# Patient Record
Sex: Male | Born: 1965 | Race: White | Hispanic: No | Smoking: Never smoker
Health system: Southern US, Community
[De-identification: ages and names within clinical notes are randomized; demographics above are authoritative.]

## PROBLEM LIST (undated history)

## (undated) DIAGNOSIS — M549 Dorsalgia, unspecified: Secondary | ICD-10-CM

## (undated) DIAGNOSIS — M5136 Other intervertebral disc degeneration, lumbar region: Secondary | ICD-10-CM

## (undated) DIAGNOSIS — M5126 Other intervertebral disc displacement, lumbar region: Secondary | ICD-10-CM

## (undated) DIAGNOSIS — R5383 Other fatigue: Secondary | ICD-10-CM

## (undated) DIAGNOSIS — M2550 Pain in unspecified joint: Secondary | ICD-10-CM

## (undated) DIAGNOSIS — M5430 Sciatica, unspecified side: Secondary | ICD-10-CM

## (undated) DIAGNOSIS — K219 Gastro-esophageal reflux disease without esophagitis: Secondary | ICD-10-CM

## (undated) DIAGNOSIS — J301 Allergic rhinitis due to pollen: Secondary | ICD-10-CM

## (undated) DIAGNOSIS — J302 Other seasonal allergic rhinitis: Secondary | ICD-10-CM

## (undated) DIAGNOSIS — M51369 Other intervertebral disc degeneration, lumbar region without mention of lumbar back pain or lower extremity pain: Secondary | ICD-10-CM

## (undated) DIAGNOSIS — Z85828 Personal history of other malignant neoplasm of skin: Secondary | ICD-10-CM

## (undated) DIAGNOSIS — M791 Myalgia, unspecified site: Secondary | ICD-10-CM

## (undated) HISTORY — DX: Gastro-esophageal reflux disease without esophagitis: K21.9

## (undated) HISTORY — DX: Other intervertebral disc degeneration, lumbar region: M51.36

## (undated) HISTORY — DX: Other seasonal allergic rhinitis: J30.2

## (undated) HISTORY — DX: Allergic rhinitis due to pollen: J30.1

## (undated) HISTORY — PX: BACK SURGERY: SHX140

## (undated) HISTORY — DX: Other intervertebral disc displacement, lumbar region: M51.26

## (undated) HISTORY — DX: Dorsalgia, unspecified: M54.9

## (undated) HISTORY — DX: Other fatigue: R53.83

## (undated) HISTORY — DX: Myalgia, unspecified site: M79.10

## (undated) HISTORY — DX: Personal history of other malignant neoplasm of skin: Z85.828

## (undated) HISTORY — DX: Sciatica, unspecified side: M54.30

## (undated) HISTORY — DX: Pain in unspecified joint: M25.50

## (undated) HISTORY — DX: Other intervertebral disc degeneration, lumbar region without mention of lumbar back pain or lower extremity pain: M51.369

---

## 2002-10-14 ENCOUNTER — Ambulatory Visit (HOSPITAL_COMMUNITY): Admission: RE | Admit: 2002-10-14 | Discharge: 2002-10-14 | Payer: Self-pay | Admitting: Internal Medicine

## 2002-10-14 ENCOUNTER — Encounter: Payer: Self-pay | Admitting: Internal Medicine

## 2002-11-09 ENCOUNTER — Encounter: Admission: RE | Admit: 2002-11-09 | Discharge: 2002-11-09 | Payer: Self-pay | Admitting: Neurosurgery

## 2002-11-09 ENCOUNTER — Encounter: Payer: Self-pay | Admitting: Neurosurgery

## 2002-11-23 ENCOUNTER — Encounter: Payer: Self-pay | Admitting: Neurosurgery

## 2002-11-23 ENCOUNTER — Encounter: Admission: RE | Admit: 2002-11-23 | Discharge: 2002-11-23 | Payer: Self-pay | Admitting: Neurosurgery

## 2002-12-08 ENCOUNTER — Encounter: Admission: RE | Admit: 2002-12-08 | Discharge: 2002-12-08 | Payer: Self-pay | Admitting: Neurosurgery

## 2002-12-08 ENCOUNTER — Encounter: Payer: Self-pay | Admitting: Neurosurgery

## 2003-12-19 ENCOUNTER — Encounter: Admission: RE | Admit: 2003-12-19 | Discharge: 2003-12-19 | Payer: Self-pay | Admitting: Neurosurgery

## 2004-01-02 ENCOUNTER — Encounter: Admission: RE | Admit: 2004-01-02 | Discharge: 2004-01-02 | Payer: Self-pay | Admitting: Neurosurgery

## 2004-01-19 ENCOUNTER — Ambulatory Visit (HOSPITAL_COMMUNITY): Admission: RE | Admit: 2004-01-19 | Discharge: 2004-01-20 | Payer: Self-pay | Admitting: Neurosurgery

## 2004-12-06 ENCOUNTER — Ambulatory Visit: Payer: Self-pay | Admitting: Internal Medicine

## 2005-07-18 ENCOUNTER — Encounter: Admission: RE | Admit: 2005-07-18 | Discharge: 2005-07-18 | Payer: Self-pay | Admitting: Neurosurgery

## 2005-08-01 ENCOUNTER — Encounter: Admission: RE | Admit: 2005-08-01 | Discharge: 2005-08-01 | Payer: Self-pay | Admitting: Neurosurgery

## 2008-12-12 ENCOUNTER — Encounter: Payer: Self-pay | Admitting: Orthopedic Surgery

## 2008-12-12 ENCOUNTER — Ambulatory Visit: Payer: Self-pay | Admitting: Internal Medicine

## 2008-12-13 ENCOUNTER — Ambulatory Visit: Payer: Self-pay | Admitting: Orthopedic Surgery

## 2008-12-13 DIAGNOSIS — M7580 Other shoulder lesions, unspecified shoulder: Secondary | ICD-10-CM

## 2008-12-13 DIAGNOSIS — M25819 Other specified joint disorders, unspecified shoulder: Secondary | ICD-10-CM | POA: Insufficient documentation

## 2008-12-13 DIAGNOSIS — M25519 Pain in unspecified shoulder: Secondary | ICD-10-CM | POA: Insufficient documentation

## 2008-12-25 HISTORY — PX: COLONOSCOPY: SHX174

## 2009-01-05 ENCOUNTER — Ambulatory Visit (HOSPITAL_COMMUNITY): Admission: RE | Admit: 2009-01-05 | Discharge: 2009-01-05 | Payer: Self-pay | Admitting: Internal Medicine

## 2009-01-05 ENCOUNTER — Ambulatory Visit: Payer: Self-pay | Admitting: Internal Medicine

## 2010-07-15 ENCOUNTER — Ambulatory Visit (HOSPITAL_COMMUNITY): Admission: RE | Admit: 2010-07-15 | Discharge: 2010-07-15 | Payer: Self-pay | Admitting: Internal Medicine

## 2010-08-02 ENCOUNTER — Encounter: Admission: RE | Admit: 2010-08-02 | Discharge: 2010-08-02 | Payer: Self-pay | Admitting: Orthopedic Surgery

## 2010-12-14 ENCOUNTER — Encounter: Payer: Self-pay | Admitting: Neurosurgery

## 2010-12-24 NOTE — Assessment & Plan Note (Signed)
Summary: left shoulder pain/needs xrays/[facility]c.cbt    History of Present Illness: I saw Darrell Mckinney in the office today for an initial visit.  He is a 45 years old man with the complaint of:  Left shoulder pain.  Referral from Dr. Ouida Sills for eval and treat.  Patient will have xrays today.  Patient states that he has had pain since 9/09. He states that it hurts to raise his arm. He has pain and numbness in his elbow and fingers on occasion. No neck pain. patient takes Celebrex on occasion. He has not had any injections.         Updated Prior Medication List: celebrex as needed zyrtec Current Allergies: No known allergies   Past Medical History:    HERNIATED DISC (LOWER BACK)  Past Surgical History:    Maccro discectomy L4-L5. 01-19-04   Family History:    FH of Cancer:   Social History:    Patient is married.     pilot   Risk Factors:  Tobacco use:  never Caffeine use:  2 drinks per day Alcohol use:  yes    Drinks per day:  2   Review of Systems  General      Denies weight loss, weight gain, fever, chills, and fatigue.  Cardiac      Denies chest pain, angina, heart attack, heart failure, poor circulation, blood clots, and phlebitis.  Resp      Denies short of breath, difficulty breathing, COPD, cough, and pneumonia.  GI      Denies nausea, vomiting, diarrhea, constipation, difficulty swallowing, ulcers, GERD, and reflux.  GU      Denies kidney failure, kidney transplant, kidney stones, burning, poor stream, testicular cancer, blood in urine, and .  Neuro      Complains of numbness.      Denies headache, dizziness, migraines, weakness, tremor, and unsteady walking.  MS      Denies joint pain, rheumatoid arthritis, joint swelling, gout, bone cancer, osteoporosis, and .  Endo      Denies thyroid disease, goiter, and diabetes.  Psych      Denies depression, mood swings, anxiety, panic attack, bipolar, and schizophrenia.  Derm      Complains of  eczema.      Denies cancer and itching.  EENT      Denies poor vision, cataracts, glaucoma, poor hearing, vertigo, ears ringing, sinusitis, hoarseness, toothaches, and bleeding gums.  Immunology      Complains of seasonal allergies.      Denies sinus problems and allergic to bee stings.  Lymphatic      Denies lymph node cancer and lymph edema.   Physical Exam  Msk:      The general apperance was normal. There were no deformities. There was normal grooming hygiene. Patient had a very muscular build.  Pulses:     normal pulses and perfusion noted in the cardiovascular tree. Extremities:     cervical spine nontender no deformity normal range of motion   RIGHT shoulder full range of motion. there was decreased internal rotation of the LEFT shoulder approximately 5 levels on the LEFT.Tenderness over the LEFT posterior subacromial region. Normal on the RIGHT. Motor exam normal in both shoulders with painful response elicited with supraspinatus resistance. However cuff strength was normal on the LEFT as well as the RIGHT. Abduction external rotation stress test normal in both shoulders. Neurologic:     normal reflexes in the upper extremity, normal sensation, negative Spurling sign. Skin:  intact without lesions or rashes Cervical Nodes:     no significant adenopathy Psych:     alert and cooperative; normal mood and affect; normal attention span and concentration     Impression & Recommendations:  Problem # 1:  SHOULDER PAIN (ICD-719.41) Assessment: New  Orders: New Patient Level III (41324) Depo- Medrol 40mg  (J1030) Lidocaine Injection hcl 10 mg (J2001) Joint Aspirate / Injection, Large (40102)   Problem # 2:  IMPINGEMENT SYNDROME (ICD-726.2) Assessment: New x-rays of the LEFT shoulder Findings: normal glenohumeral articulation with no bony abnormality. The acromion was a type II.  Impression type II acromion with normal glenohumeral joint.  Assessment I think the  patient has impingement syndrome without cuff tear. Recommend injection and home exercise program. Patient literature dispersed included impingement syndrome from the [employer] of orthopedic surgeons and a shoulder bursitis pamphlet with exercises for the shoulder including stretching and strengthening. I discussed the treatment of this condition with the patient in detail.  Injection LEFT shoulder Verbal consent obtained/The shoulder was injected with depomedrol 40mg /cc and sensorcaine .25% . There were no complications  Orders: New Patient Level III (72536)    Patient Instructions: 1)  You have received an injection of cortisone today. You may experience increased pain at the injection site. Apply ice pack to the area for 20 minutes every 2 hours and take 2 xtra strength tylenol every 8 hours. This increased pain will usually resolve in 24 hours. The injection will take effect in 3-10 days.  2)  Exercise program for the rotator cuff  3)  Please schedule a follow-up appointment in 2 months.

## 2010-12-24 NOTE — Letter (Signed)
Summary: Historic Patient File  Historic Patient File   Imported By: Elvera Maria 12/15/2008 10:56:24  _____________________________________________________________________  External Attachment:    Type:   Image     Comment:   history

## 2011-04-08 NOTE — Op Note (Signed)
NAMEKYANDRE, OKRAY               ACCOUNT NO.:  [id number]   MEDICAL RECORD NO.:  [id number]          PATIENT TYPE:  AMB   LOCATION:  DAY                           FACILITY:  [facility]   PHYSICIAN:  R. Roetta Sessions, M.D. DATE OF BIRTH:  23-Nov-1966   DATE OF PROCEDURE:  01/05/2009  DATE OF DISCHARGE:                               OPERATIVE REPORT   PROCEDURE:  Ileocolonoscopy, diagnostic.   INDICATIONS FOR PROCEDURE:  Mr. Dibello is a pleasant 45 year old  gentleman with a single episode of painless hematochezia over several-  day period during the Christmas holidays in 2009.  He has never passed  blood prior to this episode, has not passed any blood whatsoever, or has  any other GI symptoms for that matter.  Since that episode now in fact  he noted no blood with his colon prep yesterday.  Family history is  positive for colon cancer in his dad, diagnosed at age 49, his dad's  brother was also diagnosed with colon cancer in his 68s.  Reportedly  colonoscopy now being done.  Risks, benefits, alternatives, and  limitations have been reviewed, questions answered.  Please see the  documentation in the medical record.   PROCEDURE NOTE:  O2 saturation, blood pressure, pulse, and respirations  monitored throughout the entire procedure.   CONSCIOUS SEDATION:  Versed 4 mg IV and Demerol 100 mg IV in divided  doses.   INSTRUMENT:  Pentax video chip system.   FINDINGS:  Digital rectal exam revealed no abnormalities.  Endoscopic  findings:  The prep was excellent.  Colon:  Colonic mucosa was surveyed  from the rectosigmoid junction through the left transverse, right colon  to the appendiceal orifice, ileocecal valve, and cecum.  These  structures well seen and photographed for the record.  Terminal ileum  was intubated 10 cm from this level, scope was slowly and cautiously  withdrawn.  All previously mentioned mucosal surfaces were again seen.  The colonic mucosa appeared normal.  Scope was  pulled down in the  rectum.  A thorough examination of the rectal mucosa including  retroflexed view of the anal verge and in detail en face view of the  anal canal and rectum demonstrated no mucosal abnormalities.  The  colonic mucosa and the terminal ileal mucosa again was appeared  completely normal.  The patient tolerated the procedure well and was  reactive in Endoscopy.  Cecal  withdrawal time 11 minutes.   IMPRESSION:  Normal rectum, colon, and terminal ileum.  Today's findings  are reassuring.  I suspect benign anorectal bleeding possibly related to  strain, related to vigorous exercising and weightlifting.   Mr. Borowiak in the past has had transient constipation, but denies any  recently.   RECOMMENDATIONS:  1. Consider bolstering daily fiber intake typically would suggest      Benefiber 1 tablespoon daily.  2. He does have a family history of first and second-degree relative      with colorectal cancer, but first-degree relative was not      considered to be at young age.  Therefore, I would recommend he  will return for a repeat screening colonoscopy in 5-7 years.  If he      has any further rectal bleeding, he is to let me know.      Jonathon Bellows, M.D.  Electronically Signed     RMR/MEDQ  D:  01/05/2009  T:  01/05/2009  Job:  [id number]   cc:   Kingsley Callander. Ouida Sills, MD  Fax: [phone]

## 2011-04-08 NOTE — Consult Note (Signed)
Darrell Mckinney, Darrell Mckinney               ACCOUNT NO.:  [id number]   MEDICAL RECORD NO.:  [id number]          PATIENT TYPE:  AMB   LOCATION:  DAY                           FACILITY:  [facility]   PHYSICIAN:  R. Roetta Sessions, M.D. DATE OF BIRTH:  [birth year]   DATE OF CONSULTATION:  12/12/2008  DATE OF DISCHARGE:                                 CONSULTATION   REASON FOR CONSULTATION:  Hematochezia.  Positive family history of  colon cancer.   HISTORY OF PRESENT ILLNESS:  Mr. Darrell Mckinney is a very pleasant 45-  year-old gentleman seen at request of Dr. Carylon Perches to further evaluate  a recent episode hematochezia.  Mr. Darrell Mckinney noted at the Christmas  holidays about 8 days of passing gross red blood per rectum with bowel  movement, his wife saw blood in the toilet water.  This was not  associated with any anorectal or abdominal pain whatsoever.  He does  have a history of sporadic constipation, but denies constipation during  this.  He was having at least one bowel movement daily.  He was not  having any diarrhea.  He does not recall having any bleeding prior to  this episode nor he has had any since that time when the last episode  being some 3 weeks ago.  He has never had his lower GI tract evaluated.  His family history is significant in that his father was diagnosed with  colon cancer at age 25 and succumbed to disease at age 78 and his  father's brother also was diagnosed with colon cancer in his 36s.  Mr.  Darrell Mckinney really does not have any other GI tract symptoms such as  odynophagia, dysphagia, early sign of reflux symptoms, nausea, vomiting,  or early satiety.  Dr. Ouida Sills saw him in the office on November 23, 2008  performed a digital rectal exam and he found no abnormalities, brown  stools.  Hemoccult negative.  His last labs go back to May 2009 when he  had an completely normal CBC and CHEM-20.  His lipid profile looked good  with a total cholesterol of 128, the LDL is 62, and an HDL 49.   PAST MEDICAL HISTORY:  Significant for occasional diskogenic back pain  and seasonal allergies.   PAST SURGICAL HISTORY:  Vasectomy.   CURRENT MEDICATIONS:  Celebrex, Vasotec p.r.n.   ALLERGIES:  No known drug allergies.   FAMILY HISTORY:  Mother is at age 27 alive in good health.  Father died  at age 70 of colorectal cancer.  He has 3 sisters in good health and 2  brothers in good health.   SOCIAL HISTORY:  The patient is married.  He has 2 children.  He is a  Insurance claims handler.  No tobacco use.  He does consume alcohol in the way of 2 beers daily.  No illicit drugs.   REVIEW OF SYSTEMS:  As in history of present illness.  He has not had  any chest pain or dyspnea on exertion.  No spontaneous weight loss.  No  fever, chills, or  night sweats.   PHYSICAL EXAMINATION:  GENERAL:  Reveals a pleasant 46 year old  gentleman, resting comfortably.  VITAL SIGNS:  Weight 234, height 6 feet 1 inches, temperature 98.1,  blood pressure 124/82, and pulse 72.  SKIN:  Warm and dry.  There is no jaundice.  No cutaneous stigmata of  chronic liver disease.  HEENT:  Conjunctivae are pink.  CHEST:  Lungs are clear to auscultation.  HEART:  Regular rate and rhythm without murmur, gallop, or rub.  ABDOMEN:  Nondistended.  Positive bowel sounds, soft, nontender without  appreciable mass or  organomegaly.  RECTAL:  Deferred at the time of colonoscopy.   IMPRESSION:  Mr. Myrle Mckinney is a very pleasant 45 year old gentleman  with a several day history of painless hematochezia.  He has not really  had any problems with rectal bleeding prior to this episode or since  this time.  I suspected all likelihood etiology of bleeding is benign  and localized to the anorectal area.  However, at this point the  etiology of bleeding is not well defined.  He does have a positive  family history of colon cancer in a first-degree relative, although at   relatively advanced age.   I told Mr. Darrell Mckinney that we ought to go ahead and do a diagnostic  colonoscopy in the near future.  Risks, benefits, alternatives,  limitations have been discussed and questions answered.  He is  agreeable.  I will plan to perform a diagnostic colonoscopy in the very  near future and make further recommendations at that time.   I would like thank Dr. Carylon Perches for allowing me to see this nice  gentleman today in consultation.      Darrell Mckinney, M.D.  Electronically Signed     RMR/MEDQ  D:  12/12/2008  T:  12/13/2008  Job:  [id number]   cc:   Kingsley Callander. Ouida Sills, MD  Fax: [phone]

## 2011-04-11 NOTE — H&P (Signed)
Darrell Mckinney, Darrell Mckinney                         ACCOUNT NO.:  [id number]   MEDICAL RECORD NO.:  [id number]                   PATIENT TYPE:  OIB   LOCATION:  3008                                 FACILITY:  [facility]   PHYSICIAN:  Payton Doughty, M.D.                   DATE OF BIRTH:  1966/10/04   DATE OF ADMISSION:  01/19/2004  DATE OF DISCHARGE:                                HISTORY & PHYSICAL   ADMISSION DIAGNOSIS:  Herniated disc at lumbar vertebrae-4/5.   HISTORY OF PRESENT ILLNESS:  This is a very nice now 45 year old right-  handed white gentleman who has had a ruptured disc at L4/5 and a  spondylolisthesis at L5 that was noted a year ago.  Did really well with  conservative measures and a few weeks ago was riding on a plane and had the  onset of back and left lower extremity pain.  MRI showed a marked increase  in the L4/5 disc and he is now admitted for discectomy.   PAST MEDICAL HISTORY:  Unremarkable for significant disease.   MEDICATIONS:  He takes Allegra for his sinuses.   ALLERGIES:  No allergies.   PAST SURGICAL HISTORY:  No operations.   SOCIAL HISTORY:  Does not smoke.  He is a social drinker.  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   REVIEW OF SYMPTOMS:  Remarkable for back and leg pain.   PHYSICAL EXAMINATION:  HEENT:  Within normal limits.  NECK:  He has good range of motion of the neck.  CHEST:  Clear.  CARDIOVASCULAR:  Regular rate and rhythm.  ABDOMEN:  Nontender.  No hepatosplenomegaly.  EXTREMITIES:  Without clubbing or cyanosis.  GENITOURINARY:  Deferred.  PULSES:  Peripheral pulses are good.  NEUROLOGICAL:  He is awake, alert, and oriented.  His cranial nerves are  intact.  Motor exam shows 5/5strength throughout the upper and lower  extremity, save the for dorsiflexor on the left side which is 5/5.  There is  no current sensory deficit.  Reflexes are 2 at the knees, 2 at the ankles.  Straight leg raises is positive on the left.  Reverse straight leg raises  is  not positive.   LABORATORY DATA:  MRI and plane films show a large disc at L4/5 but no  evidence of increase slip at L5-S1.   IMPRESSION:  Left lumbar verterbrae-5 radiculopathy secondary to herniated  disc.   PLAN:  The plan is for lumbar laminectomy and discectomy.  The disc is  somewhat central.  The operation will be done from the left side since that  is the side of the symptoms.  The risks and benefits have been approached  and been discussed with him.  He wishes to proceed.  Payton Doughty, M.D.    MWR/MEDQ  D:  01/19/2004  T:  01/19/2004  Job:  [id number]

## 2011-04-11 NOTE — Op Note (Signed)
NAMECOLLYN, RIBAS                         ACCOUNT NO.:  [id number]   MEDICAL RECORD NO.:  [id number]                   PATIENT TYPE:  OIB   LOCATION:  3008                                 FACILITY:  [facility]   PHYSICIAN:  Payton Doughty, M.D.                   DATE OF BIRTH:  [birth year]   DATE OF PROCEDURE:  01/19/2004  DATE OF DISCHARGE:                                 OPERATIVE REPORT   PREOPERATIVE DIAGNOSIS:  Herniated disc lumbar verterbrae-4/5.   POSTOPERATIVE DIAGNOSIS:  Herniated disc lumbar vertebrae-4/5.   OPERATION:  Left L4/5 laminectomy and discectomy.   SURGEON:  Payton Doughty, M.D.   SERVICE:  Neurosurgery.   ASSISTANT:  Cristi Loron, M.D.   ANESTHESIA:  General endotracheal anesthesia.   PREPARATION:  Prepped with Betadine prepped and scrubbed with alcohol wipe.   COMPLICATIONS:  None.   INDICATIONS:  This 45 year old gentleman with a herniated disc at L4/5 on  the left.   DESCRIPTION OF PROCEDURE:  He was taken to the operating room and smoothly  each side was intubated. Placed prone on the operating table.  Following  shave, prepped and draped in the usual sterile fashion.  Skin was  infiltrated with 1% Lidocaine with 1:400,000 epinephrine.  The skin was  incised over the lamina of L4.  The lamina was dissected free.  Intraoperative x-rays showed a marker to be under L3.  The laminectomy was  done on L4.  Hemi/semi-laminectomy of L4 was carried out to the top of the  ligamentum flavum and it was then removed in a retrograde fashion.  This  demonstrated a left L5 root.  This was gently dissected free and retracted  medially with the D'Errico retractor.  Immediately obvious was a herniated  disc.  The remaining annular fibers were divided and the disc removed.  Exploration of the anterior epidural space was then carried out and was  productive of several smaller fragments.  The disc space was evacuated of  all graspable fragments and gently curetted to  dislodge the marginally clean  fragments that were also removed.   The nerve root was then explored in all quadrants and found to be free.  The  wound was irrigated and hemostasis insured.  Laminectomy defect was deep  filled with Depo-Medrol soaked fat.  The fascia was reapproximate with 0  Vicryl in interrupted fashion.  Subcutaneous tissue was reapproximated with  0 Vicryl in interrupted fashion.  Subcuticular tissue was reapproximated with 3-0 Vicryl in interrupted  fashion.  The skin was closed with 4-0 Vicryl in running subcuticular  fashion.  Benzoin and Steri-Strips were placed and made occlusive with Telfa  and OpSite.  The patient returned to the recovery room in good condition.  Payton Doughty, M.D.    MWR/MEDQ  D:  01/19/2004  T:  01/19/2004  Job:  [id number]

## 2012-12-09 ENCOUNTER — Ambulatory Visit (INDEPENDENT_AMBULATORY_CARE_PROVIDER_SITE_OTHER): Payer: 59 | Admitting: Otolaryngology

## 2012-12-09 DIAGNOSIS — J310 Chronic rhinitis: Secondary | ICD-10-CM

## 2012-12-09 DIAGNOSIS — J343 Hypertrophy of nasal turbinates: Secondary | ICD-10-CM

## 2013-01-13 ENCOUNTER — Ambulatory Visit (INDEPENDENT_AMBULATORY_CARE_PROVIDER_SITE_OTHER): Payer: 59 | Admitting: Otolaryngology

## 2013-01-13 DIAGNOSIS — J310 Chronic rhinitis: Secondary | ICD-10-CM

## 2013-01-13 DIAGNOSIS — J343 Hypertrophy of nasal turbinates: Secondary | ICD-10-CM

## 2013-01-20 ENCOUNTER — Encounter (HOSPITAL_BASED_OUTPATIENT_CLINIC_OR_DEPARTMENT_OTHER): Payer: Self-pay | Admitting: *Deleted

## 2013-01-25 ENCOUNTER — Encounter (HOSPITAL_BASED_OUTPATIENT_CLINIC_OR_DEPARTMENT_OTHER): Payer: Self-pay | Admitting: Anesthesiology

## 2013-01-25 ENCOUNTER — Ambulatory Visit (HOSPITAL_BASED_OUTPATIENT_CLINIC_OR_DEPARTMENT_OTHER): Payer: 59 | Admitting: Anesthesiology

## 2013-01-25 ENCOUNTER — Encounter (HOSPITAL_BASED_OUTPATIENT_CLINIC_OR_DEPARTMENT_OTHER)
Admission: RE | Disposition: A | Discharge status: Home / Self Care | Payer: Self-pay | Source: Ambulatory Visit | Attending: Otolaryngology

## 2013-01-25 ENCOUNTER — Ambulatory Visit (HOSPITAL_BASED_OUTPATIENT_CLINIC_OR_DEPARTMENT_OTHER)
Admission: RE | Admit: 2013-01-25 | Discharge: 2013-01-25 | Disposition: A | Discharge status: Home / Self Care | Payer: 59 | Source: Ambulatory Visit | Attending: Otolaryngology | Admitting: Otolaryngology

## 2013-01-25 DIAGNOSIS — J329 Chronic sinusitis, unspecified: Secondary | ICD-10-CM | POA: Insufficient documentation

## 2013-01-25 DIAGNOSIS — J343 Hypertrophy of nasal turbinates: Secondary | ICD-10-CM | POA: Insufficient documentation

## 2013-01-25 DIAGNOSIS — IMO0002 Reserved for concepts with insufficient information to code with codable children: Secondary | ICD-10-CM | POA: Insufficient documentation

## 2013-01-25 HISTORY — PX: TURBINATE RESECTION: SHX6158

## 2013-01-25 LAB — POCT HEMOGLOBIN-HEMACUE: Hemoglobin: 15.9 g/dL (ref 13.0–17.0)

## 2013-01-25 SURGERY — TURBINATE RESECTION
Anesthesia: General | Site: Nose | Laterality: Bilateral | Wound class: Clean Contaminated

## 2013-01-25 MED ORDER — LIDOCAINE HCL (CARDIAC) 20 MG/ML IV SOLN
INTRAVENOUS | Status: DC | PRN
  Administered 2013-01-25: 100 mg via INTRAVENOUS

## 2013-01-25 MED ORDER — OXYCODONE HCL 5 MG/5ML PO SOLN: 5.0000 mg | Freq: Once | ORAL | Status: DC | PRN

## 2013-01-25 MED ORDER — OXYCODONE-ACETAMINOPHEN 5-325 MG PO TABS: 1.0000 | ORAL_TABLET | ORAL | Status: DC | PRN

## 2013-01-25 MED ORDER — ONDANSETRON HCL 4 MG/2ML IJ SOLN
INTRAMUSCULAR | Status: DC | PRN
  Administered 2013-01-25: 4 mg via INTRAVENOUS

## 2013-01-25 MED ORDER — MIDAZOLAM HCL 5 MG/5ML IJ SOLN
INTRAMUSCULAR | Status: DC | PRN
  Administered 2013-01-25: 2 mg via INTRAVENOUS

## 2013-01-25 MED ORDER — FENTANYL CITRATE 0.05 MG/ML IJ SOLN
INTRAMUSCULAR | Status: DC | PRN
  Administered 2013-01-25: 100 ug via INTRAVENOUS
  Administered 2013-01-25: 50 ug via INTRAVENOUS

## 2013-01-25 MED ORDER — HYDROMORPHONE HCL PF 1 MG/ML IJ SOLN: 0.2500 mg | INTRAMUSCULAR | Status: DC | PRN

## 2013-01-25 MED ORDER — LACTATED RINGERS IV SOLN
INTRAVENOUS | Status: DC
  Administered 2013-01-25 (×2): via INTRAVENOUS

## 2013-01-25 MED ORDER — PROPOFOL 10 MG/ML IV BOLUS
INTRAVENOUS | Status: DC | PRN
  Administered 2013-01-25: 200 mg via INTRAVENOUS

## 2013-01-25 MED ORDER — DEXAMETHASONE SODIUM PHOSPHATE 4 MG/ML IJ SOLN
INTRAMUSCULAR | Status: DC | PRN
  Administered 2013-01-25: 10 mg via INTRAVENOUS

## 2013-01-25 MED ORDER — OXYCODONE HCL 5 MG PO TABS: 5.0000 mg | ORAL_TABLET | Freq: Once | ORAL | Status: DC | PRN

## 2013-01-25 MED ORDER — OXYMETAZOLINE HCL 0.05 % NA SOLN
NASAL | Status: DC | PRN
  Administered 2013-01-25: 1 via NASAL

## 2013-01-25 MED ORDER — AMOXICILLIN 875 MG PO TABS: 875.0000 mg | ORAL_TABLET | Freq: Two times a day (BID) | ORAL | Status: AC

## 2013-01-25 MED ORDER — SUCCINYLCHOLINE CHLORIDE 20 MG/ML IJ SOLN
INTRAMUSCULAR | Status: DC | PRN
  Administered 2013-01-25: 100 mg via INTRAVENOUS

## 2013-01-25 MED ORDER — MIDAZOLAM HCL 2 MG/2ML IJ SOLN: 1.0000 mg | INTRAMUSCULAR | Status: DC | PRN

## 2013-01-25 MED ORDER — FENTANYL CITRATE 0.05 MG/ML IJ SOLN: 50.0000 ug | INTRAMUSCULAR | Status: DC | PRN

## 2013-01-25 SURGICAL SUPPLY — 39 items
BLADE SURG 15 STRL LF DISP TIS (BLADE) IMPLANT
BLADE SURG 15 STRL SS (BLADE)
CANISTER SUCTION 1200CC (MISCELLANEOUS) ×2 IMPLANT
CLOTH BEACON ORANGE TIMEOUT ST (SAFETY) IMPLANT
COAGULATOR SUCT 8FR VV (MISCELLANEOUS) ×2 IMPLANT
COVER MAYO STAND STRL (DRAPES) ×2 IMPLANT
DECANTER SPIKE VIAL GLASS SM (MISCELLANEOUS) IMPLANT
DRSG NASOPORE 8CM (GAUZE/BANDAGES/DRESSINGS) IMPLANT
DRSG TELFA 3X8 NADH (GAUZE/BANDAGES/DRESSINGS) IMPLANT
ELECT REM PT RETURN 9FT ADLT (ELECTROSURGICAL) ×2
ELECTRODE REM PT RTRN 9FT ADLT (ELECTROSURGICAL) ×1 IMPLANT
GLOVE BIO SURGEON STRL SZ7 (GLOVE) ×1 IMPLANT
GLOVE BIO SURGEON STRL SZ7.5 (GLOVE) ×2 IMPLANT
GOWN PREVENTION PLUS XLARGE (GOWN DISPOSABLE) ×2 IMPLANT
GOWN PREVENTION PLUS XXLARGE (GOWN DISPOSABLE) ×1 IMPLANT
NDL HYPO 25X1 1.5 SAFETY (NEEDLE) ×1 IMPLANT
NEEDLE HYPO 25X1 1.5 SAFETY (NEEDLE) ×2 IMPLANT
NS IRRIG 1000ML POUR BTL (IV SOLUTION) IMPLANT
PACK BASIN DAY SURGERY FS (CUSTOM PROCEDURE TRAY) ×2 IMPLANT
PACK ENT DAY SURGERY (CUSTOM PROCEDURE TRAY) IMPLANT
PAD DRESSING TELFA 3X8 NADH (GAUZE/BANDAGES/DRESSINGS) IMPLANT
SHEET MEDIUM DRAPE 40X70 STRL (DRAPES) ×2 IMPLANT
SLEEVE SCD COMPRESS KNEE MED (MISCELLANEOUS) IMPLANT
SOLUTION BUTLER CLEAR DIP (MISCELLANEOUS) ×2 IMPLANT
SPLINT NASAL DOYLE BI-VL (GAUZE/BANDAGES/DRESSINGS) IMPLANT
SPONGE GAUZE 2X2 8PLY STRL LF (GAUZE/BANDAGES/DRESSINGS) ×2 IMPLANT
SPONGE NEURO XRAY DETECT 1X3 (DISPOSABLE) ×2 IMPLANT
SUT CHROMIC 4 0 P 3 18 (SUTURE) ×2 IMPLANT
SUT PLAIN 4 0 ~~LOC~~ 1 (SUTURE) IMPLANT
SUT PROLENE 3 0 PS 2 (SUTURE) IMPLANT
SUT VIC AB 4-0 P-3 18XBRD (SUTURE) IMPLANT
SUT VIC AB 4-0 P3 18 (SUTURE)
SYR CONTROL 10ML LL (SYRINGE) IMPLANT
TOWEL OR 17X24 6PK STRL BLUE (TOWEL DISPOSABLE) ×2 IMPLANT
TUBE CONNECTING 20X1/4 (TUBING) ×2 IMPLANT
TUBE SALEM SUMP 12R W/ARV (TUBING) IMPLANT
TUBE SALEM SUMP 16 FR W/ARV (TUBING) ×2 IMPLANT
WATER STERILE IRR 1000ML POUR (IV SOLUTION) IMPLANT
YANKAUER SUCT BULB TIP NO VENT (SUCTIONS) ×2 IMPLANT

## 2013-01-25 NOTE — Op Note (Signed)
DATE OF PROCEDURE: 01/25/2013  OPERATIVE REPORT   SURGEON: Newman Pies, MD   PREOPERATIVE DIAGNOSES:  1. Chronic nasal obstruction.  2. Bilateral inferior turbinate hypertrophy.   POSTOPERATIVE DIAGNOSES:  1. Chronic nasal obstruction.  2. Bilateral inferior turbinate hypertrophy.   PROCEDURE PERFORMED: Bilateral partial inferior turbinate resection.   ANESTHESIA: General endotracheal tube anesthesia.   COMPLICATIONS: None.   ESTIMATED BLOOD LOSS: Minimal.   INDICATION FOR PROCEDURE :Darrell Mckinney is a 47 y.o. male with a history of chronic nasal obstruction. The patient was  treated with antihistamine, decongestant, steroid nasal spray, and systemic steroids. However, the patient continues to be symptomatic. On examination, the patient was noted to have bilateral severe inferior turbinate hypertrophy, causing significant nasal obstruction. Based on the above findings, the decision was made for the patient to undergo the above-stated procedure. The risks, benefits, alternatives, and details of the procedure were discussed with the patient. Questions were invited and answered. Informed consent was obtained.   DESCRIPTION: The patient was taken to the operating room and placed supine on the operating table. General endotracheal tube anesthesia was administered by the anesthesiologist. The patient was positioned and prepped and draped in a standard fashion for nasal surgery. Pledgets soaked with Afrin were placed in both nasal cavities. The pledgets were subsequently removed. Examination of the nasal cavities revealed bilateral severe inferior turbinate hypertrophy. The inferior one-half of each inferior turbinate was then crossclamped with a straight Kelly clamp. The inferior one-half of each inferior turbinate was then resected with a pair of cross cutting scissors. Hemostasis was achieved with suction electrocautery, under direct visual guidance of the zero-degree endoscope. Good hemostasis was  achieved. The care of the patient was turned over to the anesthesiologist. The patient was awakened from anesthesia without difficulty. The patient was extubated and transferred to the recovery room in good condition.   OPERATIVE FINDINGS: Bilateral inferior turbinate hypertrophy.   SPECIMEN: None.   FOLLOWUP CARE: The patient will be discharged home once he is awake and alert. The patient will be placed on Percocet 1-2 tablets p.o. q.4 h. p.r.n. pain, and amoxicillin 875 mg p.o. b.i.d. for 5 days. The patient will follow up in my office in approximately 1 week.   Newman Pies, MD

## 2013-01-25 NOTE — H&P (Signed)
  H&P Update  Pt's original H&P dated 01/13/13 reviewed and placed in chart (to be scanned).  I personally examined the patient today.  No change in health. Proceed with bilateral partial inferior turbinate resection.

## 2013-01-25 NOTE — Brief Op Note (Signed)
01/25/2013  9:45 AM  PATIENT:  Darrell Mckinney  47 y.o. male  PRE-OPERATIVE DIAGNOSIS:  BILATERAL TURBINATE HYPERTROPHY, CHRONIC NASAL OBSTRUCTION  POST-OPERATIVE DIAGNOSIS:  BILATERAL TURBINATE HYPERTROPHY, CHRONIC NASAL OBSTRUCTION  PROCEDURE:  Procedure(s): BILATERAL PARTIAL INFERIOR TURBINATE RESECTION (Bilateral)  SURGEON:  Surgeon(s) and Role:    * Darletta Moll, MD - Primary  PHYSICIAN ASSISTANT:   ASSISTANTS: none   ANESTHESIA:   general  EBL:  Total I/O In: 1200 [I.V.:1200] Out: -   BLOOD ADMINISTERED:none  DRAINS: none   LOCAL MEDICATIONS USED:  NONE  SPECIMEN:  No Specimen  DISPOSITION OF SPECIMEN:  N/A  COUNTS:  YES  TOURNIQUET:  * No tourniquets in log *  DICTATION: .Note written in EPIC  PLAN OF CARE: Discharge to home after PACU  PATIENT DISPOSITION:  PACU - hemodynamically stable.   Delay start of Pharmacological VTE agent (>24hrs) due to surgical blood loss or risk of bleeding: not applicable

## 2013-01-25 NOTE — Anesthesia Postprocedure Evaluation (Signed)
  Anesthesia Post-op Note  Patient: Darrell Mckinney  Procedure(s) Performed: Procedure(s): BILATERAL TURBINATE RESECTION (Bilateral)  Patient Location: PACU  Anesthesia Type:General  Level of Consciousness: awake and alert   Airway and Oxygen Therapy: Patient Spontanous Breathing  Post-op Pain: mild  Post-op Assessment: Post-op Vital signs reviewed, Patient's Cardiovascular Status Stable, Respiratory Function Stable, Patent Airway and No signs of Nausea or vomiting  Post-op Vital Signs: Reviewed and stable  Complications: No apparent anesthesia complications

## 2013-01-25 NOTE — Transfer of Care (Signed)
Immediate Anesthesia Transfer of Care Note  Patient: Darrell Mckinney  Procedure(s) Performed: Procedure(s): BILATERAL TURBINATE RESECTION (Bilateral)  Patient Location: PACU  Anesthesia Type:General  Level of Consciousness: awake and alert   Airway & Oxygen Therapy: Patient Spontanous Breathing and Patient connected to face mask oxygen  Post-op Assessment: Report given to PACU RN and Post -op Vital signs reviewed and stable  Post vital signs: Reviewed and stable  Complications: No apparent anesthesia complications

## 2013-01-25 NOTE — Anesthesia Procedure Notes (Signed)
Procedure Name: Intubation Date/Time: 01/25/2013 9:01 AM Performed by: Caren Macadam Pre-anesthesia Checklist: Patient identified, Emergency Drugs available, Suction available and Patient being monitored Patient Re-evaluated:Patient Re-evaluated prior to inductionOxygen Delivery Method: Circle System Utilized Preoxygenation: Pre-oxygenation with 100% oxygen Intubation Type: IV induction Ventilation: Mask ventilation without difficulty Laryngoscope Size: Miller and 3 Grade View: Grade I Tube type: Oral Tube size: 8.0 mm Number of attempts: 1 Airway Equipment and Method: stylet and oral airway Placement Confirmation: ETT inserted through vocal cords under direct vision,  positive ETCO2 and breath sounds checked- equal and bilateral Secured at: 22 cm Tube secured with: Tape Dental Injury: Teeth and Oropharynx as per pre-operative assessment

## 2013-01-25 NOTE — Anesthesia Preprocedure Evaluation (Signed)
Anesthesia Evaluation  Patient identified by MRN, date of birth, ID band Patient awake    Reviewed: Allergy & Precautions, H&P , NPO status , Patient's Chart, lab work & pertinent test results  Airway Mallampati: II TM Distance: >3 FB Neck ROM: Full    Dental no notable dental hx. (+) Teeth Intact and Dental Advisory Given   Pulmonary neg pulmonary ROS,  breath sounds clear to auscultation  Pulmonary exam normal       Cardiovascular negative cardio ROS  Rhythm:Regular Rate:Normal     Neuro/Psych negative neurological ROS  negative psych ROS   GI/Hepatic negative GI ROS, Neg liver ROS,   Endo/Other  negative endocrine ROS  Renal/GU negative Renal ROS  negative genitourinary   Musculoskeletal   Abdominal   Peds  Hematology negative hematology ROS (+)   Anesthesia Other Findings   Reproductive/Obstetrics negative OB ROS                           Anesthesia Physical Anesthesia Plan  ASA: II  Anesthesia Plan: General   Post-op Pain Management:    Induction: Intravenous  Airway Management Planned: Oral ETT  Additional Equipment:   Intra-op Plan:   Post-operative Plan: Extubation in OR  Informed Consent: I have reviewed the patients History and Physical, chart, labs and discussed the procedure including the risks, benefits and alternatives for the proposed anesthesia with the patient or authorized representative who has indicated his/her understanding and acceptance.   Dental advisory given  Plan Discussed with: CRNA  Anesthesia Plan Comments:         Anesthesia Quick Evaluation

## 2013-01-26 ENCOUNTER — Encounter (HOSPITAL_BASED_OUTPATIENT_CLINIC_OR_DEPARTMENT_OTHER): Payer: Self-pay | Admitting: Otolaryngology

## 2013-02-10 ENCOUNTER — Ambulatory Visit (INDEPENDENT_AMBULATORY_CARE_PROVIDER_SITE_OTHER): Payer: 59 | Admitting: Otolaryngology

## 2013-08-11 ENCOUNTER — Ambulatory Visit (INDEPENDENT_AMBULATORY_CARE_PROVIDER_SITE_OTHER): Payer: 59 | Admitting: Otolaryngology

## 2013-08-11 DIAGNOSIS — G4700 Insomnia, unspecified: Secondary | ICD-10-CM

## 2013-08-11 DIAGNOSIS — J310 Chronic rhinitis: Secondary | ICD-10-CM

## 2013-08-11 DIAGNOSIS — R070 Pain in throat: Secondary | ICD-10-CM

## 2013-08-11 DIAGNOSIS — K219 Gastro-esophageal reflux disease without esophagitis: Secondary | ICD-10-CM

## 2013-08-11 DIAGNOSIS — R0982 Postnasal drip: Secondary | ICD-10-CM

## 2013-09-22 ENCOUNTER — Ambulatory Visit (INDEPENDENT_AMBULATORY_CARE_PROVIDER_SITE_OTHER): Payer: 59 | Admitting: Otolaryngology

## 2013-09-22 ENCOUNTER — Encounter (INDEPENDENT_AMBULATORY_CARE_PROVIDER_SITE_OTHER): Payer: Self-pay

## 2013-09-22 DIAGNOSIS — J310 Chronic rhinitis: Secondary | ICD-10-CM

## 2013-09-22 DIAGNOSIS — R070 Pain in throat: Secondary | ICD-10-CM

## 2013-10-18 ENCOUNTER — Other Ambulatory Visit: Payer: Self-pay

## 2013-10-18 DIAGNOSIS — G4730 Sleep apnea, unspecified: Secondary | ICD-10-CM

## 2014-01-31 ENCOUNTER — Other Ambulatory Visit: Payer: Self-pay | Admitting: Allergy

## 2014-01-31 ENCOUNTER — Ambulatory Visit
Admission: RE | Admit: 2014-01-31 | Discharge: 2014-01-31 | Disposition: A | Discharge status: Home / Self Care | Payer: 59 | Source: Ambulatory Visit | Attending: Allergy | Admitting: Allergy

## 2014-01-31 DIAGNOSIS — J329 Chronic sinusitis, unspecified: Secondary | ICD-10-CM

## 2014-04-18 ENCOUNTER — Other Ambulatory Visit (HOSPITAL_COMMUNITY): Payer: Self-pay | Admitting: Internal Medicine

## 2014-04-18 DIAGNOSIS — M5412 Radiculopathy, cervical region: Secondary | ICD-10-CM

## 2014-04-19 ENCOUNTER — Ambulatory Visit (HOSPITAL_COMMUNITY)
Admission: RE | Admit: 2014-04-19 | Discharge: 2014-04-19 | Disposition: A | Discharge status: Home / Self Care | Payer: 59 | Source: Ambulatory Visit | Attending: Internal Medicine | Admitting: Internal Medicine

## 2014-04-19 DIAGNOSIS — M5124 Other intervertebral disc displacement, thoracic region: Secondary | ICD-10-CM | POA: Insufficient documentation

## 2014-04-19 DIAGNOSIS — M47812 Spondylosis without myelopathy or radiculopathy, cervical region: Secondary | ICD-10-CM | POA: Insufficient documentation

## 2014-04-19 DIAGNOSIS — M5412 Radiculopathy, cervical region: Secondary | ICD-10-CM

## 2014-04-19 DIAGNOSIS — M25519 Pain in unspecified shoulder: Secondary | ICD-10-CM | POA: Insufficient documentation

## 2014-04-19 DIAGNOSIS — M5020 Other cervical disc displacement, unspecified cervical region: Secondary | ICD-10-CM | POA: Insufficient documentation

## 2014-07-21 ENCOUNTER — Institutional Professional Consult (permissible substitution): Payer: 59 | Admitting: Internal Medicine

## 2015-10-01 ENCOUNTER — Encounter: Payer: Self-pay | Admitting: Gastroenterology

## 2015-10-01 ENCOUNTER — Other Ambulatory Visit: Payer: Self-pay

## 2015-10-01 ENCOUNTER — Ambulatory Visit (INDEPENDENT_AMBULATORY_CARE_PROVIDER_SITE_OTHER): Payer: 59 | Admitting: Gastroenterology

## 2015-10-01 VITALS — BP 135/86 | HR 80 | Temp 97.3°F | Ht 73.0 in | Wt 248.2 lb

## 2015-10-01 DIAGNOSIS — Z800 Family history of malignant neoplasm of digestive organs: Secondary | ICD-10-CM | POA: Diagnosis not present

## 2015-10-01 DIAGNOSIS — Z8371 Family history of colonic polyps: Secondary | ICD-10-CM

## 2015-10-01 MED ORDER — PEG 3350-KCL-NA BICARB-NACL 420 G PO SOLR: 4000.0000 mL | ORAL | Status: DC

## 2015-10-01 NOTE — Assessment & Plan Note (Signed)
Due for high risk screening colonoscopy as recommended by Dr. Gala Romney. FH of [place] father and uncle as outlined. Previously recommended for 5-7 year follow. Last colonoscopy in 2010.  I have discussed the risks, alternatives, benefits with regards to but not limited to the risk of reaction to medication, bleeding, infection, perforation and the patient is agreeable to proceed. Written consent to be obtained.

## 2015-10-01 NOTE — Progress Notes (Signed)
Primary Care Physician:  Asencion Noble, MD  Primary Gastroenterologist:  Garfield Cornea, MD   Chief Complaint  Patient presents with  . set up TCS    HPI:  Darrell Mckinney is a 49 y.o. male here to schedule high risk screening colonoscopy. Doing well from a GI standpoint. Bowel movements regular. No blood in the stool or melena. No abdominal pain. No heartburn, dysphagia, vomiting. Appetite is good. He is an Emergency planning/management officer. Return from [place] recently. One week ago with low-grade fever of 101, coughing (had been present for weeks prior to this illness), body aches, diarrhea. Evaluated at urgent care and provided Levaquin which she is currently completing. Had taken Z-Pak back in October for cough. Clinically much improved.   Current Outpatient Prescriptions  Medication Sig Dispense Refill  . atovaquone-proguanil (MALARONE) 250-100 MG TABS tablet     . levocetirizine (XYZAL) 5 MG tablet     . montelukast (SINGULAIR) 10 MG tablet Take 10 mg by mouth daily.      No current facility-administered medications for this visit.    Allergies as of 10/01/2015  . (No Known Allergies)    No past medical history on file.  Past Surgical History  Procedure Laterality Date  . Back surgery  ~2006  . 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 Colonoscopy  12/2008    RMR: normal    Family History  Problem Relation Age of Onset  . Colon cancer Father     dx age 58, deceased age 83  . Colon cancer Paternal Uncle     age 42s    Social History   Social History  . Marital Status: Married    Spouse Name: N/A  . Number of Children: 2  . Years of Education: N/A   Occupational History  . Not on file.   Social History Main Topics  . Smoking status: Never Smoker   . Smokeless tobacco: Never Used  . Alcohol Use: 0.0 oz/week    0 Standard drinks or  equivalent per week     Comment: six pack per week  . Drug Use: No  . Sexual Activity: Not on file   Other Topics Concern  . Not on file   Social History Narrative      ROS:  General: Negative for anorexia, weight loss, fever, chills, fatigue, weakness. Eyes: Negative for vision changes.  ENT: Negative for hoarseness, difficulty swallowing , nasal congestion. CV: Negative for chest pain, angina, palpitations, dyspnea on exertion, peripheral edema.  Respiratory: Negative for dyspnea at rest, dyspnea on exertion,  wheezing. See hpi GI: See history of present illness. GU:  Negative for dysuria, hematuria, urinary incontinence, urinary frequency, nocturnal urination.  MS: Negative for joint pain, low back pain.  Derm: Negative for rash or itching.  Neuro: Negative for weakness, abnormal sensation, seizure, frequent headaches, memory loss, confusion.  Psych: Negative for anxiety, depression, suicidal ideation, hallucinations.  Endo: Negative for unusual weight change.  Heme: Negative for bruising or bleeding. Allergy: Negative for rash or hives.    Physical Examination:  BP 135/86 mmHg  Pulse 80  Temp(Src) 97.3 F (36.3 C)  Ht 6\' 1"  (1.854 m)  Wt 248 lb 3.2 oz (112.583 kg)  BMI 32.75 kg/m2   General: Well-nourished, well-developed in no acute distress.  Head: Normocephalic, atraumatic.   Eyes: Conjunctiva pink, no icterus. Mouth: Oropharyngeal  mucosa moist and pink , no lesions erythema or exudate. Neck: Supple without thyromegaly, masses, or lymphadenopathy.  Lungs: Clear to auscultation bilaterally.  Heart: Regular rate and rhythm, no murmurs rubs or gallops.  Abdomen: Bowel sounds are normal, nontender, nondistended, no hepatosplenomegaly or masses, no abdominal bruits or    hernia , no rebound or guarding.   Rectal: not performed Extremities: No lower extremity edema. No clubbing or deformities.  Neuro: Alert and oriented x 4 , grossly normal neurologically.  Skin:  Warm and dry, no rash or jaundice.   Psych: Alert and cooperative, normal mood and affect.  Labs: May 2016 BUN 13, creatinine 1.16, total bilirubin 0.5, alkaline phosphatase 58, AST 27, ALT 46, white blood cell count 12,100, hemoglobin 16, platelets 164,000  Imaging Studies: No results found.

## 2015-10-01 NOTE — Progress Notes (Signed)
CC'D TO PCP °

## 2015-10-01 NOTE — Patient Instructions (Signed)
Colonoscopy as scheduled. See separate instructions.  

## 2015-10-15 ENCOUNTER — Other Ambulatory Visit (HOSPITAL_COMMUNITY): Payer: Self-pay | Admitting: Internal Medicine

## 2015-10-15 ENCOUNTER — Ambulatory Visit (HOSPITAL_COMMUNITY)
Admission: RE | Admit: 2015-10-15 | Discharge: 2015-10-15 | Disposition: A | Discharge status: Home / Self Care | Payer: 59 | Source: Ambulatory Visit | Attending: Internal Medicine | Admitting: Internal Medicine

## 2015-10-15 DIAGNOSIS — R059 Cough, unspecified: Secondary | ICD-10-CM

## 2015-10-15 DIAGNOSIS — R05 Cough: Secondary | ICD-10-CM | POA: Insufficient documentation

## 2015-10-15 DIAGNOSIS — R509 Fever, unspecified: Secondary | ICD-10-CM | POA: Insufficient documentation

## 2015-10-15 DIAGNOSIS — R0602 Shortness of breath: Secondary | ICD-10-CM | POA: Insufficient documentation

## 2015-10-23 ENCOUNTER — Telehealth: Payer: Self-pay | Admitting: Internal Medicine

## 2015-10-23 NOTE — Telephone Encounter (Signed)
I talked with the wife and faxed her the instructions to her work for him

## 2015-10-23 NOTE — Telephone Encounter (Signed)
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 I offered to fax to the pharmacy, but he has already picked up his prep. I told her if I mail them I didn't know if he would get them in time and could she stop by the office to pick up another copy. She said she was at work and lives in [place] and we would be closed by the time she got here. She said that someone was to have mailed these already and they haven't received them. I told her they may have been mailed and I have no way of telling if they were and I would put another copy of his prep instructions in the mail today.

## 2015-10-26 ENCOUNTER — Encounter (HOSPITAL_COMMUNITY)
Admission: RE | Disposition: A | Discharge status: Home / Self Care | Payer: Self-pay | Source: Ambulatory Visit | Attending: Internal Medicine

## 2015-10-26 ENCOUNTER — Ambulatory Visit (HOSPITAL_COMMUNITY)
Admission: RE | Admit: 2015-10-26 | Discharge: 2015-10-26 | Disposition: A | Discharge status: Home / Self Care | Payer: 59 | Source: Ambulatory Visit | Attending: Internal Medicine | Admitting: Internal Medicine

## 2015-10-26 ENCOUNTER — Encounter (HOSPITAL_COMMUNITY): Payer: Self-pay | Admitting: *Deleted

## 2015-10-26 DIAGNOSIS — Z1211 Encounter for screening for malignant neoplasm of colon: Secondary | ICD-10-CM | POA: Insufficient documentation

## 2015-10-26 DIAGNOSIS — Z8371 Family history of colonic polyps: Secondary | ICD-10-CM | POA: Diagnosis not present

## 2015-10-26 DIAGNOSIS — Z800 Family history of malignant neoplasm of digestive organs: Secondary | ICD-10-CM | POA: Insufficient documentation

## 2015-10-26 DIAGNOSIS — Z83719 Family history of colon polyps, unspecified: Secondary | ICD-10-CM | POA: Insufficient documentation

## 2015-10-26 HISTORY — PX: COLONOSCOPY: SHX5424

## 2015-10-26 SURGERY — COLONOSCOPY
Anesthesia: Moderate Sedation

## 2015-10-26 MED ORDER — MIDAZOLAM HCL 5 MG/5ML IJ SOLN
INTRAMUSCULAR | Status: AC
  Filled 2015-10-26: qty 10

## 2015-10-26 MED ORDER — MIDAZOLAM HCL 5 MG/5ML IJ SOLN
INTRAMUSCULAR | Status: DC | PRN
  Administered 2015-10-26 (×2): 2 mg via INTRAVENOUS
  Administered 2015-10-26: 1 mg via INTRAVENOUS

## 2015-10-26 MED ORDER — MEPERIDINE HCL 100 MG/ML IJ SOLN
INTRAMUSCULAR | Status: DC | PRN
  Administered 2015-10-26 (×2): 50 mg via INTRAVENOUS

## 2015-10-26 MED ORDER — MEPERIDINE HCL 100 MG/ML IJ SOLN
INTRAMUSCULAR | Status: AC
  Filled 2015-10-26: qty 2

## 2015-10-26 MED ORDER — SIMETHICONE 40 MG/0.6ML PO SUSP
ORAL | Status: DC | PRN
  Administered 2015-10-26: 13:00:00

## 2015-10-26 MED ORDER — SODIUM CHLORIDE 0.9 % IV SOLN
INTRAVENOUS | Status: DC
  Administered 2015-10-26: 12:00:00 via INTRAVENOUS

## 2015-10-26 MED ORDER — ONDANSETRON HCL 4 MG/2ML IJ SOLN
INTRAMUSCULAR | Status: DC | PRN
  Administered 2015-10-26: 4 mg via INTRAVENOUS

## 2015-10-26 MED ORDER — ONDANSETRON HCL 4 MG/2ML IJ SOLN
INTRAMUSCULAR | Status: AC
  Filled 2015-10-26: qty 2

## 2015-10-26 NOTE — Discharge Instructions (Signed)
°  Colonoscopy Discharge Instructions  Read the instructions outlined below and refer to this sheet in the next few weeks. These discharge instructions provide you with general information on caring for yourself after you leave the hospital. Your doctor may also give you specific instructions. While your treatment has been planned according to the most current medical practices available, unavoidable complications occasionally occur. If you have any problems or questions after discharge, call Dr. Gala Romney at [phone]. ACTIVITY  You may resume your regular activity, but move at a slower pace for the next 24 hours.   Take frequent rest periods for the next 24 hours.   Walking will help get rid of the air and reduce the bloated feeling in your belly (abdomen).   No driving for 24 hours (because of the medicine (anesthesia) used during the test).    Do not sign any important legal documents or operate any machinery for 24 hours (because of the anesthesia used during the test).  NUTRITION  Drink plenty of fluids.   You may resume your normal diet as instructed by your doctor.   Begin with a light meal and progress to your normal diet. Heavy or fried foods are harder to digest and may make you feel sick to your stomach (nauseated).   Avoid alcoholic beverages for 24 hours or as instructed.  MEDICATIONS  You may resume your normal medications unless your doctor tells you otherwise.  WHAT YOU CAN EXPECT TODAY  Some feelings of bloating in the abdomen.   Passage of more gas than usual.   Spotting of blood in your stool or on the toilet paper.  IF YOU HAD POLYPS REMOVED DURING THE COLONOSCOPY:  No aspirin products for 7 days or as instructed.   No alcohol for 7 days or as instructed.   Eat a soft diet for the next 24 hours.  FINDING OUT THE RESULTS OF YOUR TEST Not all test results are available during your visit. If your test results are not back during the visit, make an appointment  with your caregiver to find out the results. Do not assume everything is normal if you have not heard from your caregiver or the medical facility. It is important for you to follow up on all of your test results.  SEEK IMMEDIATE MEDICAL ATTENTION IF:  You have more than a spotting of blood in your stool.   Your belly is swollen (abdominal distention).   You are nauseated or vomiting.   You have a temperature over 101.   You have abdominal pain or discomfort that is severe or gets worse throughout the day.   Your colonoscopy was normal today  I recommend you have a repeat examination in 5 years

## 2015-10-26 NOTE — Interval H&P Note (Signed)
History and Physical Interval Note:  10/26/2015 12:32 PM  Darrell Mckinney  has presented today for surgery, with the diagnosis of family history of polyps  The various methods of treatment have been discussed with the patient and family. After consideration of risks, benefits and other options for treatment, the patient has consented to  Procedure(s) with comments: COLONOSCOPY (N/A) - 11:15 - moved to 12:00 - pt knows to arrive at 11:00 as a surgical intervention .  The patient's history has been reviewed, patient examined, no change in status, stable for surgery.  I have reviewed the patient's chart and labs.  Questions were answered to the patient's satisfaction.     Robert Rourk  No change. High-risk screening colonoscopy per plan. The risks, benefits, limitations, alternatives and imponderables have been reviewed with the patient. Questions have been answered. All parties are agreeable.

## 2015-10-26 NOTE — H&P (View-Only) (Signed)
Primary Care Physician:  FAGAN,ROY, MD  Primary Gastroenterologist:  Yohan Rourk, MD   Chief Complaint  Patient presents with  . set up TCS    HPI:  Darrell Mckinney is a 49 y.o. male here to schedule high risk screening colonoscopy. Doing well from a GI standpoint. Bowel movements regular. No blood in the stool or melena. No abdominal pain. No heartburn, dysphagia, vomiting. Appetite is good. 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 One week ago with low-grade fever of 101, coughing (had been present for weeks prior to this illness), body aches, diarrhea. Evaluated at urgent care and provided Levaquin which she is currently completing. Had taken Z-Pak back in October for cough. Clinically much improved.   Current Outpatient Prescriptions  Medication Sig Dispense Refill  . atovaquone-proguanil (MALARONE) 250-100 MG TABS tablet     . levocetirizine (XYZAL) 5 MG tablet     . montelukast (SINGULAIR) 10 MG tablet Take 10 mg by mouth daily.      No current facility-administered medications for this visit.    Allergies as of 10/01/2015  . (No Known Allergies)    No past medical history on file.  Past Surgical History  Procedure Laterality Date  . Back surgery  ~2006  . 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 Colonoscopy  12/2008    RMR: normal    Family History  Problem Relation Age of Onset  . Colon cancer Father     dx age 67, deceased age 69  . Colon cancer Paternal Uncle     age 50s    Social History   Social History  . Marital Status: Married    Spouse Name: N/A  . Number of Children: 2  . Years of Education: N/A   Occupational History  . Not on file.   Social History Main Topics  . Smoking status: Never Smoker   . Smokeless tobacco: Never Used  . Alcohol Use: 0.0 oz/week    0 Standard drinks or  equivalent per week     Comment: six pack per week  . Drug Use: No  . Sexual Activity: Not on file   Other Topics Concern  . Not on file   Social History Narrative      ROS:  General: Negative for anorexia, weight loss, fever, chills, fatigue, weakness. Eyes: Negative for vision changes.  ENT: Negative for hoarseness, difficulty swallowing , nasal congestion. CV: Negative for chest pain, angina, palpitations, dyspnea on exertion, peripheral edema.  Respiratory: Negative for dyspnea at rest, dyspnea on exertion,  wheezing. See hpi GI: See history of present illness. GU:  Negative for dysuria, hematuria, urinary incontinence, urinary frequency, nocturnal urination.  MS: Negative for joint pain, low back pain.  Derm: Negative for rash or itching.  Neuro: Negative for weakness, abnormal sensation, seizure, frequent headaches, memory loss, confusion.  Psych: Negative for anxiety, depression, suicidal ideation, hallucinations.  Endo: Negative for unusual weight change.  Heme: Negative for bruising or bleeding. Allergy: Negative for rash or hives.    Physical Examination:  BP 135/86 mmHg  Pulse 80  Temp(Src) 97.3 F (36.3 C)  Ht 6' 1" (1.854 m)  Wt 248 lb 3.2 oz (112.583 kg)  BMI 32.75 kg/m2   General: Well-nourished, well-developed in no acute distress.  Head: Normocephalic, atraumatic.   Eyes: Conjunctiva pink, no icterus. Mouth: Oropharyngeal   mucosa moist and pink , no lesions erythema or exudate. Neck: Supple without thyromegaly, masses, or lymphadenopathy.  Lungs: Clear to auscultation bilaterally.  Heart: Regular rate and rhythm, no murmurs rubs or gallops.  Abdomen: Bowel sounds are normal, nontender, nondistended, no hepatosplenomegaly or masses, no abdominal bruits or    hernia , no rebound or guarding.   Rectal: not performed Extremities: No lower extremity edema. No clubbing or deformities.  Neuro: Alert and oriented x 4 , grossly normal neurologically.  Skin:  Warm and dry, no rash or jaundice.   Psych: Alert and cooperative, normal mood and affect.  Labs: May 2016 BUN 13, creatinine 1.16, total bilirubin 0.5, alkaline phosphatase 58, AST 27, ALT 46, white blood cell count 12,100, hemoglobin 16, platelets 164,000  Imaging Studies: No results found.    

## 2015-10-26 NOTE — Op Note (Signed)
[facility] [address] [place], [address]   COLONOSCOPY PROCEDURE REPORT  PATIENT: Darrell Mckinney, Darrell Mckinney  MR#: [id number] BIRTHDATE: 07/10/1966 , 49  yrs. old GENDER: male ENDOSCOPIST: R.  Garfield Cornea, MD FACP [facility] REFERRED BY:Roy Willey Blade, M.D. PROCEDURE DATE:  2015-11-20 PROCEDURE:   Colonoscopy, screening INDICATIONS:High risk screening examination. MEDICATIONS: Versed 5 mg IV and Demerol 100 mg IV in divided doses. Zofran 4 mg IV. ASA CLASS:       Class II  CONSENT: The risks, benefits, alternatives and imponderables including but not limited to bleeding, perforation as well as the possibility of a missed lesion have been reviewed.  The potential for biopsy, lesion removal, etc. have also been discussed. Questions have been answered.  All parties agreeable.  Please see the history and physical in the medical record for more information.  DESCRIPTION OF PROCEDURE:   After the risks benefits and alternatives of the procedure were thoroughly explained, informed consent was obtained.  The digital rectal exam revealed no abnormalities of the rectum.   The [id number]  endoscope was introduced through the anus and advanced to the cecum, which was identified by both the appendix and ileocecal valve. No adverse events experienced.   The quality of the prep was adequate  The instrument was then slowly withdrawn as the colon was fully examined. Estimated blood loss is zero unless otherwise noted in this procedure report.      COLON FINDINGS: Normal-appearing rectal mucosa.  Normal-appearing colonic mucosa.  Retroflexion was performed. .  Withdrawal time=9 minutes 0 seconds.  The scope was withdrawn and the procedure completed. COMPLICATIONS: There were no immediate complications.  ENDOSCOPIC IMPRESSION: Normal colonoscopy  RECOMMENDATIONS: Repeat examination in 5 years  eSigned:  R. Garfield Cornea, MD Rosalita Chessman [facility] 2015-11-20 1:14 PM   cc:  CPT  CODES: ICD CODES:  The ICD and CPT codes recommended by this software are interpretations from the data that the clinical staff has captured with the software.  The verification of the translation of this report to the ICD and CPT codes and modifiers is the sole responsibility of the health care institution and practicing physician where this report was generated.  [employer] will not be held responsible for the validity of the ICD and CPT codes included on this report.  AMA assumes no liability for data contained or not contained herein. CPT is a Designer, television/film set of the [employer].

## 2015-10-29 ENCOUNTER — Encounter (HOSPITAL_COMMUNITY): Payer: Self-pay | Admitting: Internal Medicine

## 2016-11-25 IMAGING — CR DG CHEST 2V
2 series · 2 of 2 positions shown · non-contrast
Comparison: 01/17/2004.

CLINICAL DATA: Cough for 3 months. Some shortness of breath. On and
off fever.

EXAM:
CHEST  2 VIEW

[view not recorded (1 of 2)]
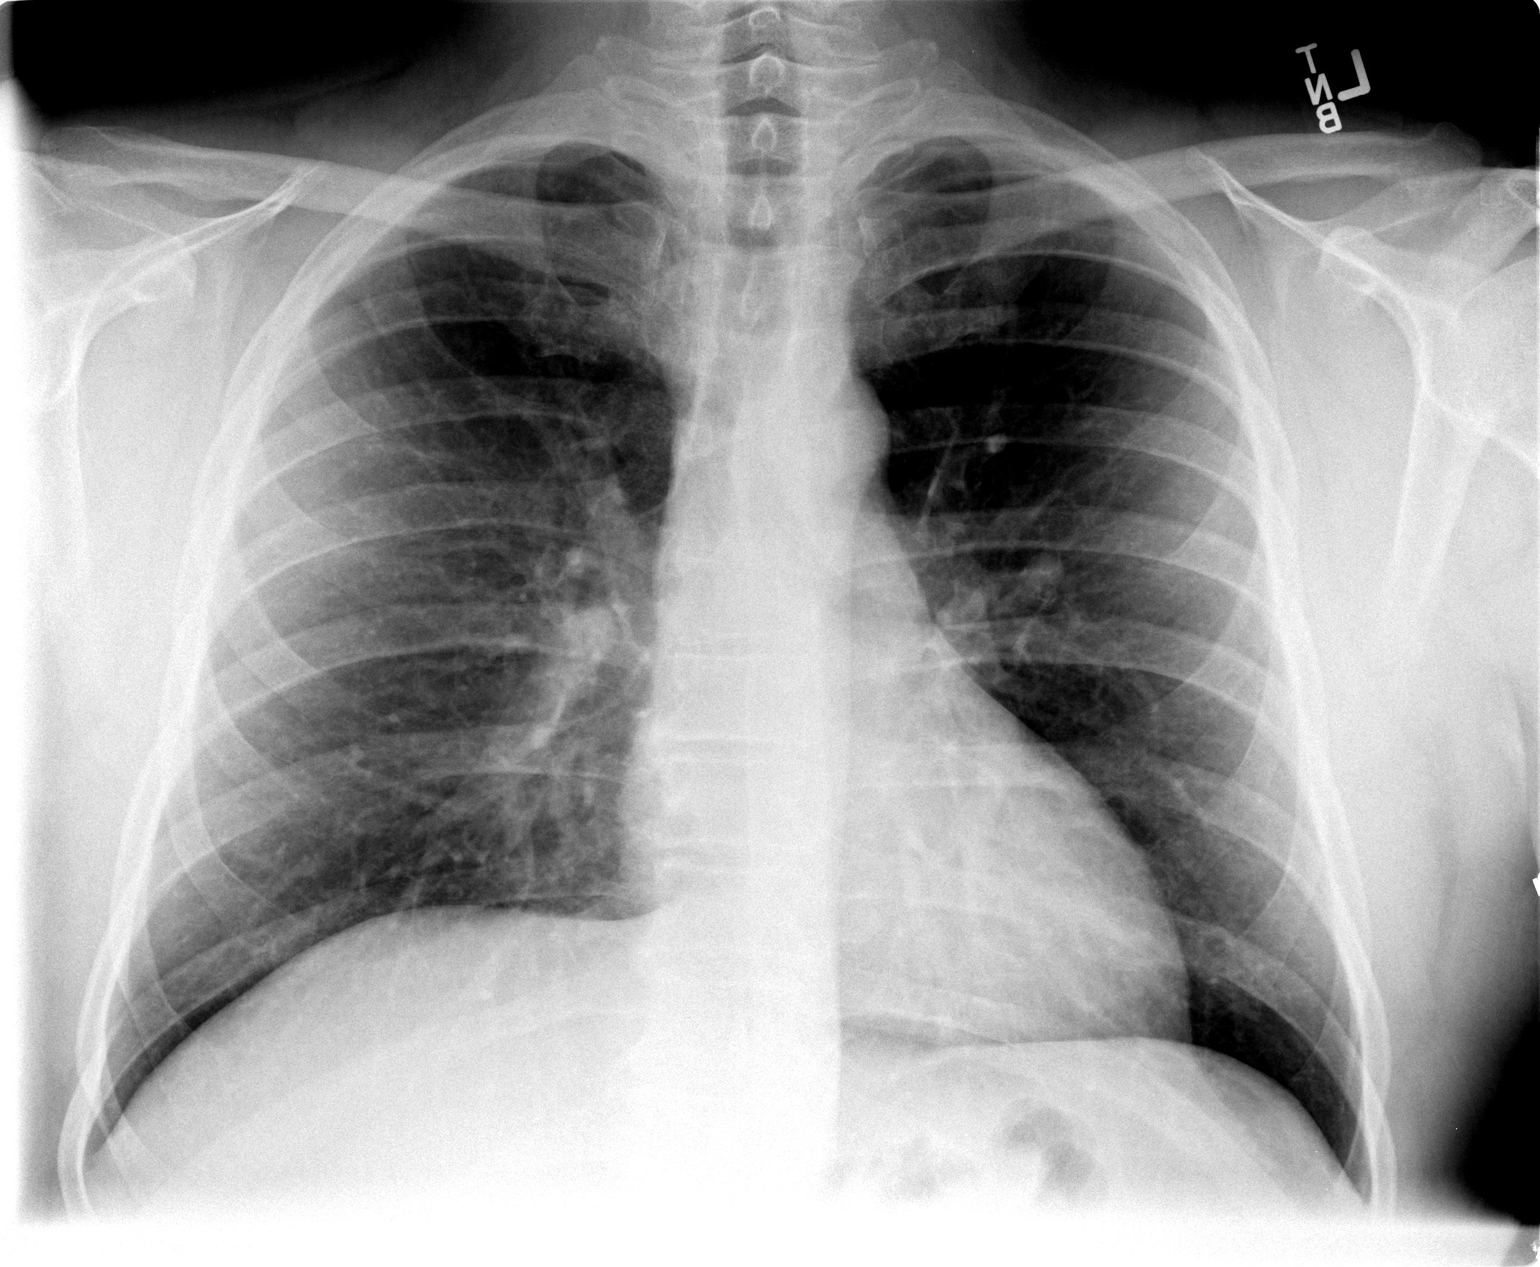

[view not recorded (2 of 2)]
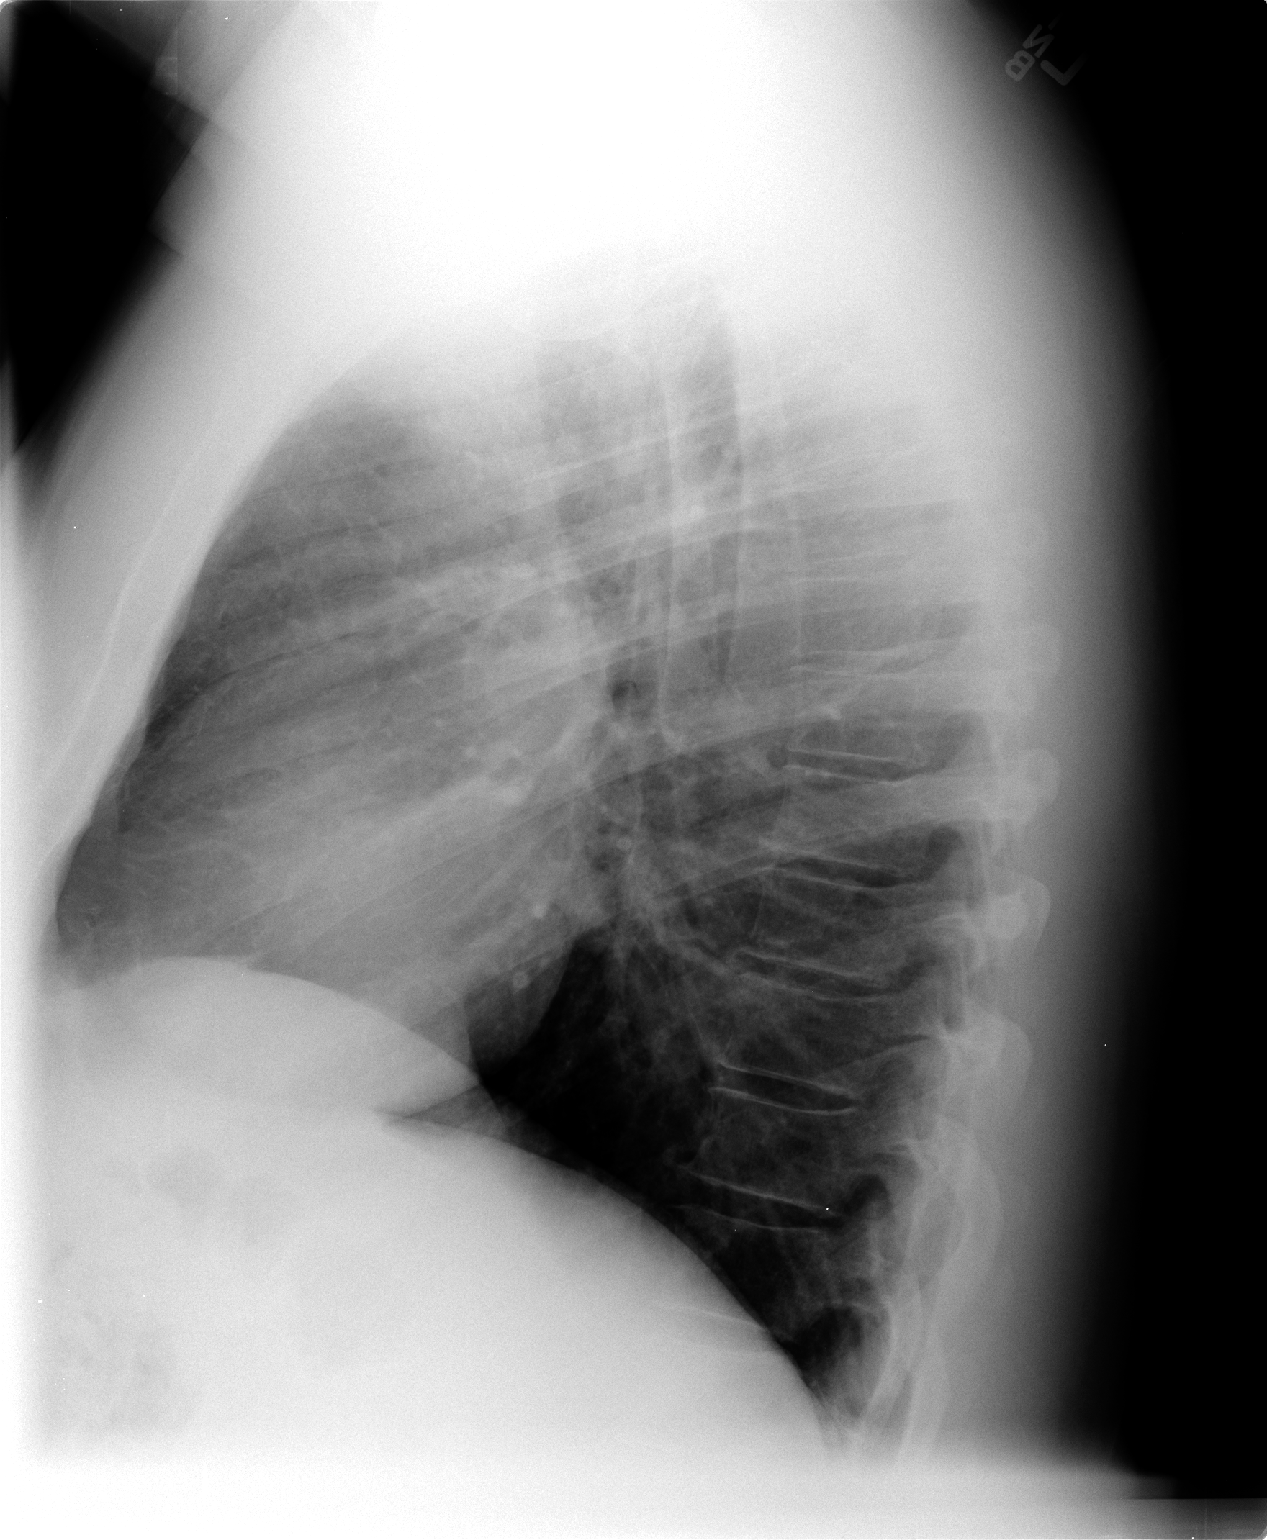

[2 of 2 positions shown; findings below may reference images not displayed]

FINDINGS: The heart size and mediastinal contours are within normal limits.
Both lungs are clear. No pleural effusion or pneumothorax. The
visualized skeletal structures are unremarkable.
IMPRESSION: No active cardiopulmonary disease.

## 2016-12-01 DIAGNOSIS — J3081 Allergic rhinitis due to animal (cat) (dog) hair and dander: Secondary | ICD-10-CM | POA: Diagnosis not present

## 2016-12-01 DIAGNOSIS — J3089 Other allergic rhinitis: Secondary | ICD-10-CM | POA: Diagnosis not present

## 2016-12-01 DIAGNOSIS — J301 Allergic rhinitis due to pollen: Secondary | ICD-10-CM | POA: Diagnosis not present

## 2016-12-09 DIAGNOSIS — J3089 Other allergic rhinitis: Secondary | ICD-10-CM | POA: Diagnosis not present

## 2016-12-09 DIAGNOSIS — J301 Allergic rhinitis due to pollen: Secondary | ICD-10-CM | POA: Diagnosis not present

## 2016-12-09 DIAGNOSIS — J3081 Allergic rhinitis due to animal (cat) (dog) hair and dander: Secondary | ICD-10-CM | POA: Diagnosis not present

## 2016-12-15 DIAGNOSIS — J3089 Other allergic rhinitis: Secondary | ICD-10-CM | POA: Diagnosis not present

## 2016-12-15 DIAGNOSIS — J301 Allergic rhinitis due to pollen: Secondary | ICD-10-CM | POA: Diagnosis not present

## 2016-12-15 DIAGNOSIS — J3081 Allergic rhinitis due to animal (cat) (dog) hair and dander: Secondary | ICD-10-CM | POA: Diagnosis not present

## 2016-12-18 DIAGNOSIS — J3081 Allergic rhinitis due to animal (cat) (dog) hair and dander: Secondary | ICD-10-CM | POA: Diagnosis not present

## 2016-12-18 DIAGNOSIS — J3089 Other allergic rhinitis: Secondary | ICD-10-CM | POA: Diagnosis not present

## 2016-12-22 DIAGNOSIS — J3089 Other allergic rhinitis: Secondary | ICD-10-CM | POA: Diagnosis not present

## 2016-12-22 DIAGNOSIS — J301 Allergic rhinitis due to pollen: Secondary | ICD-10-CM | POA: Diagnosis not present

## 2016-12-22 DIAGNOSIS — J3081 Allergic rhinitis due to animal (cat) (dog) hair and dander: Secondary | ICD-10-CM | POA: Diagnosis not present

## 2016-12-31 DIAGNOSIS — J3089 Other allergic rhinitis: Secondary | ICD-10-CM | POA: Diagnosis not present

## 2016-12-31 DIAGNOSIS — J301 Allergic rhinitis due to pollen: Secondary | ICD-10-CM | POA: Diagnosis not present

## 2016-12-31 DIAGNOSIS — J3081 Allergic rhinitis due to animal (cat) (dog) hair and dander: Secondary | ICD-10-CM | POA: Diagnosis not present

## 2017-01-05 DIAGNOSIS — J3089 Other allergic rhinitis: Secondary | ICD-10-CM | POA: Diagnosis not present

## 2017-01-05 DIAGNOSIS — J3081 Allergic rhinitis due to animal (cat) (dog) hair and dander: Secondary | ICD-10-CM | POA: Diagnosis not present

## 2017-01-05 DIAGNOSIS — J301 Allergic rhinitis due to pollen: Secondary | ICD-10-CM | POA: Diagnosis not present

## 2017-01-09 DIAGNOSIS — J3081 Allergic rhinitis due to animal (cat) (dog) hair and dander: Secondary | ICD-10-CM | POA: Diagnosis not present

## 2017-01-09 DIAGNOSIS — J3089 Other allergic rhinitis: Secondary | ICD-10-CM | POA: Diagnosis not present

## 2017-01-12 DIAGNOSIS — J3081 Allergic rhinitis due to animal (cat) (dog) hair and dander: Secondary | ICD-10-CM | POA: Diagnosis not present

## 2017-01-12 DIAGNOSIS — J3089 Other allergic rhinitis: Secondary | ICD-10-CM | POA: Diagnosis not present

## 2017-01-12 DIAGNOSIS — J301 Allergic rhinitis due to pollen: Secondary | ICD-10-CM | POA: Diagnosis not present

## 2017-01-19 DIAGNOSIS — J3089 Other allergic rhinitis: Secondary | ICD-10-CM | POA: Diagnosis not present

## 2017-01-19 DIAGNOSIS — J301 Allergic rhinitis due to pollen: Secondary | ICD-10-CM | POA: Diagnosis not present

## 2017-01-19 DIAGNOSIS — J3081 Allergic rhinitis due to animal (cat) (dog) hair and dander: Secondary | ICD-10-CM | POA: Diagnosis not present

## 2017-01-28 DIAGNOSIS — J3081 Allergic rhinitis due to animal (cat) (dog) hair and dander: Secondary | ICD-10-CM | POA: Diagnosis not present

## 2017-01-28 DIAGNOSIS — J3089 Other allergic rhinitis: Secondary | ICD-10-CM | POA: Diagnosis not present

## 2017-01-28 DIAGNOSIS — J301 Allergic rhinitis due to pollen: Secondary | ICD-10-CM | POA: Diagnosis not present

## 2017-02-04 DIAGNOSIS — J3081 Allergic rhinitis due to animal (cat) (dog) hair and dander: Secondary | ICD-10-CM | POA: Diagnosis not present

## 2017-02-04 DIAGNOSIS — J3089 Other allergic rhinitis: Secondary | ICD-10-CM | POA: Diagnosis not present

## 2017-02-04 DIAGNOSIS — J301 Allergic rhinitis due to pollen: Secondary | ICD-10-CM | POA: Diagnosis not present

## 2017-02-10 DIAGNOSIS — J3089 Other allergic rhinitis: Secondary | ICD-10-CM | POA: Diagnosis not present

## 2017-02-10 DIAGNOSIS — J3081 Allergic rhinitis due to animal (cat) (dog) hair and dander: Secondary | ICD-10-CM | POA: Diagnosis not present

## 2017-02-10 DIAGNOSIS — J301 Allergic rhinitis due to pollen: Secondary | ICD-10-CM | POA: Diagnosis not present

## 2017-02-19 DIAGNOSIS — J301 Allergic rhinitis due to pollen: Secondary | ICD-10-CM | POA: Diagnosis not present

## 2017-02-19 DIAGNOSIS — J3089 Other allergic rhinitis: Secondary | ICD-10-CM | POA: Diagnosis not present

## 2017-02-19 DIAGNOSIS — J3081 Allergic rhinitis due to animal (cat) (dog) hair and dander: Secondary | ICD-10-CM | POA: Diagnosis not present

## 2017-03-02 DIAGNOSIS — J3089 Other allergic rhinitis: Secondary | ICD-10-CM | POA: Diagnosis not present

## 2017-03-02 DIAGNOSIS — J3081 Allergic rhinitis due to animal (cat) (dog) hair and dander: Secondary | ICD-10-CM | POA: Diagnosis not present

## 2017-03-02 DIAGNOSIS — J301 Allergic rhinitis due to pollen: Secondary | ICD-10-CM | POA: Diagnosis not present

## 2017-03-10 DIAGNOSIS — J3089 Other allergic rhinitis: Secondary | ICD-10-CM | POA: Diagnosis not present

## 2017-03-10 DIAGNOSIS — J3081 Allergic rhinitis due to animal (cat) (dog) hair and dander: Secondary | ICD-10-CM | POA: Diagnosis not present

## 2017-03-10 DIAGNOSIS — J301 Allergic rhinitis due to pollen: Secondary | ICD-10-CM | POA: Diagnosis not present

## 2017-03-23 DIAGNOSIS — J3081 Allergic rhinitis due to animal (cat) (dog) hair and dander: Secondary | ICD-10-CM | POA: Diagnosis not present

## 2017-03-23 DIAGNOSIS — J3089 Other allergic rhinitis: Secondary | ICD-10-CM | POA: Diagnosis not present

## 2017-03-23 DIAGNOSIS — J301 Allergic rhinitis due to pollen: Secondary | ICD-10-CM | POA: Diagnosis not present

## 2017-03-27 DIAGNOSIS — J301 Allergic rhinitis due to pollen: Secondary | ICD-10-CM | POA: Diagnosis not present

## 2017-03-27 DIAGNOSIS — J3089 Other allergic rhinitis: Secondary | ICD-10-CM | POA: Diagnosis not present

## 2017-03-27 DIAGNOSIS — J3081 Allergic rhinitis due to animal (cat) (dog) hair and dander: Secondary | ICD-10-CM | POA: Diagnosis not present

## 2017-04-01 DIAGNOSIS — J3089 Other allergic rhinitis: Secondary | ICD-10-CM | POA: Diagnosis not present

## 2017-04-01 DIAGNOSIS — J301 Allergic rhinitis due to pollen: Secondary | ICD-10-CM | POA: Diagnosis not present

## 2017-04-01 DIAGNOSIS — J3081 Allergic rhinitis due to animal (cat) (dog) hair and dander: Secondary | ICD-10-CM | POA: Diagnosis not present

## 2017-04-07 DIAGNOSIS — J301 Allergic rhinitis due to pollen: Secondary | ICD-10-CM | POA: Diagnosis not present

## 2017-04-07 DIAGNOSIS — J3089 Other allergic rhinitis: Secondary | ICD-10-CM | POA: Diagnosis not present

## 2017-04-07 DIAGNOSIS — J3081 Allergic rhinitis due to animal (cat) (dog) hair and dander: Secondary | ICD-10-CM | POA: Diagnosis not present

## 2017-04-10 DIAGNOSIS — J301 Allergic rhinitis due to pollen: Secondary | ICD-10-CM | POA: Diagnosis not present

## 2017-04-14 DIAGNOSIS — J3081 Allergic rhinitis due to animal (cat) (dog) hair and dander: Secondary | ICD-10-CM | POA: Diagnosis not present

## 2017-04-14 DIAGNOSIS — J3089 Other allergic rhinitis: Secondary | ICD-10-CM | POA: Diagnosis not present

## 2017-04-14 DIAGNOSIS — J301 Allergic rhinitis due to pollen: Secondary | ICD-10-CM | POA: Diagnosis not present

## 2017-04-21 DIAGNOSIS — J3081 Allergic rhinitis due to animal (cat) (dog) hair and dander: Secondary | ICD-10-CM | POA: Diagnosis not present

## 2017-04-21 DIAGNOSIS — J301 Allergic rhinitis due to pollen: Secondary | ICD-10-CM | POA: Diagnosis not present

## 2017-04-21 DIAGNOSIS — J3089 Other allergic rhinitis: Secondary | ICD-10-CM | POA: Diagnosis not present

## 2017-04-28 DIAGNOSIS — J3089 Other allergic rhinitis: Secondary | ICD-10-CM | POA: Diagnosis not present

## 2017-04-28 DIAGNOSIS — J301 Allergic rhinitis due to pollen: Secondary | ICD-10-CM | POA: Diagnosis not present

## 2017-04-28 DIAGNOSIS — J3081 Allergic rhinitis due to animal (cat) (dog) hair and dander: Secondary | ICD-10-CM | POA: Diagnosis not present

## 2017-05-01 DIAGNOSIS — J301 Allergic rhinitis due to pollen: Secondary | ICD-10-CM | POA: Diagnosis not present

## 2017-05-01 DIAGNOSIS — J3089 Other allergic rhinitis: Secondary | ICD-10-CM | POA: Diagnosis not present

## 2017-05-01 DIAGNOSIS — J3081 Allergic rhinitis due to animal (cat) (dog) hair and dander: Secondary | ICD-10-CM | POA: Diagnosis not present

## 2017-05-04 DIAGNOSIS — J3081 Allergic rhinitis due to animal (cat) (dog) hair and dander: Secondary | ICD-10-CM | POA: Diagnosis not present

## 2017-05-04 DIAGNOSIS — Z0001 Encounter for general adult medical examination with abnormal findings: Secondary | ICD-10-CM | POA: Diagnosis not present

## 2017-05-04 DIAGNOSIS — Z125 Encounter for screening for malignant neoplasm of prostate: Secondary | ICD-10-CM | POA: Diagnosis not present

## 2017-05-04 DIAGNOSIS — J3089 Other allergic rhinitis: Secondary | ICD-10-CM | POA: Diagnosis not present

## 2017-05-04 DIAGNOSIS — J301 Allergic rhinitis due to pollen: Secondary | ICD-10-CM | POA: Diagnosis not present

## 2017-05-07 DIAGNOSIS — J3089 Other allergic rhinitis: Secondary | ICD-10-CM | POA: Diagnosis not present

## 2017-05-14 DIAGNOSIS — J301 Allergic rhinitis due to pollen: Secondary | ICD-10-CM | POA: Diagnosis not present

## 2017-05-14 DIAGNOSIS — J3081 Allergic rhinitis due to animal (cat) (dog) hair and dander: Secondary | ICD-10-CM | POA: Diagnosis not present

## 2017-05-14 DIAGNOSIS — J3089 Other allergic rhinitis: Secondary | ICD-10-CM | POA: Diagnosis not present

## 2017-05-19 DIAGNOSIS — L732 Hidradenitis suppurativa: Secondary | ICD-10-CM | POA: Diagnosis not present

## 2017-05-28 DIAGNOSIS — J301 Allergic rhinitis due to pollen: Secondary | ICD-10-CM | POA: Diagnosis not present

## 2017-05-28 DIAGNOSIS — J3089 Other allergic rhinitis: Secondary | ICD-10-CM | POA: Diagnosis not present

## 2017-05-28 DIAGNOSIS — J3081 Allergic rhinitis due to animal (cat) (dog) hair and dander: Secondary | ICD-10-CM | POA: Diagnosis not present

## 2017-06-02 DIAGNOSIS — J3081 Allergic rhinitis due to animal (cat) (dog) hair and dander: Secondary | ICD-10-CM | POA: Diagnosis not present

## 2017-06-02 DIAGNOSIS — J301 Allergic rhinitis due to pollen: Secondary | ICD-10-CM | POA: Diagnosis not present

## 2017-06-02 DIAGNOSIS — J3089 Other allergic rhinitis: Secondary | ICD-10-CM | POA: Diagnosis not present

## 2017-06-15 ENCOUNTER — Ambulatory Visit (INDEPENDENT_AMBULATORY_CARE_PROVIDER_SITE_OTHER): Payer: Self-pay | Admitting: Family Medicine

## 2017-06-15 VITALS — BP 142/84 | HR 54 | Ht 73.5 in | Wt 256.0 lb

## 2017-06-15 DIAGNOSIS — J3089 Other allergic rhinitis: Secondary | ICD-10-CM | POA: Diagnosis not present

## 2017-06-15 DIAGNOSIS — J3081 Allergic rhinitis due to animal (cat) (dog) hair and dander: Secondary | ICD-10-CM | POA: Diagnosis not present

## 2017-06-15 DIAGNOSIS — J301 Allergic rhinitis due to pollen: Secondary | ICD-10-CM | POA: Diagnosis not present

## 2017-06-15 DIAGNOSIS — Z029 Encounter for administrative examinations, unspecified: Secondary | ICD-10-CM

## 2017-06-15 DIAGNOSIS — Z0289 Encounter for other administrative examinations: Secondary | ICD-10-CM | POA: Insufficient documentation

## 2017-06-15 NOTE — Progress Notes (Signed)
   BP (!) 142/84   Pulse (!) 54   Ht 6' 1.5" (1.867 m)   Wt 256 lb (116.1 kg)   BMI 33.32 kg/m    Subjective:    Patient ID: Darrell Mckinney, male    DOB: 1966-08-30, 51 y.o.   MRN: [id number]  HPI: Darrell Mckinney is a 51 y.o. male  Chief Complaint  Patient presents with  . Flight    Class 1     Relevant past medical, surgical, family and social history reviewed and updated as indicated. Interim medical history since our last visit reviewed. Allergies and medications reviewed and updated.  Review of Systems  Constitutional: Negative.   HENT: Negative.   Eyes: Negative.   Respiratory: Negative.   Cardiovascular: Negative.   Gastrointestinal: Negative.   Endocrine: Negative.   Genitourinary: Negative.   Musculoskeletal: Negative.   Skin: Negative.   Allergic/Immunologic: Negative.   Neurological: Negative.   Hematological: Negative.   Psychiatric/Behavioral: Negative.     Per HPI unless specifically indicated above     Objective:    BP (!) 142/84   Pulse (!) 54   Ht 6' 1.5" (1.867 m)   Wt 256 lb (116.1 kg)   BMI 33.32 kg/m   Wt Readings from Last 3 Encounters:  06/15/17 256 lb (116.1 kg)  10/26/15 245 lb (111.1 kg)  10/01/15 248 lb 3.2 oz (112.6 kg)    Physical Exam  Constitutional: He is oriented to person, place, and time. He appears well-developed and well-nourished.  HENT:  Head: Normocephalic.  Right Ear: External ear normal.  Left Ear: External ear normal.  Nose: Nose normal.  Eyes: Pupils are equal, round, and reactive to light. Conjunctivae and EOM are normal.  Neck: Normal range of motion. Neck supple. No thyromegaly present.  Cardiovascular: Normal rate, regular rhythm, normal heart sounds and intact distal pulses.   Pulmonary/Chest: Effort normal and breath sounds normal.  Abdominal: Soft. Bowel sounds are normal. There is no splenomegaly or hepatomegaly.  Genitourinary: Penis normal.  Musculoskeletal: Normal range of motion.    Lymphadenopathy:    He has no cervical adenopathy.  Neurological: He is alert and oriented to person, place, and time. He has normal reflexes.  Skin: Skin is warm and dry.  Psychiatric: He has a normal mood and affect. His behavior is normal. Judgment and thought content normal.    Results for orders placed or performed during the hospital encounter of 01/25/13  Hemoglobin-hemacue, POC  Result Value Ref Range   Hemoglobin 15.9 13.0 - 17.0 g/dL      Assessment & Plan:   Problem List Items Addressed This Visit      Other   Encounter for Dispensing optician) examination - Primary   Relevant Orders   EKG 12-Lead (Completed)       Follow up plan: Return for Physical Exam.

## 2017-06-23 DIAGNOSIS — J3081 Allergic rhinitis due to animal (cat) (dog) hair and dander: Secondary | ICD-10-CM | POA: Diagnosis not present

## 2017-06-23 DIAGNOSIS — J301 Allergic rhinitis due to pollen: Secondary | ICD-10-CM | POA: Diagnosis not present

## 2017-06-23 DIAGNOSIS — J3089 Other allergic rhinitis: Secondary | ICD-10-CM | POA: Diagnosis not present

## 2017-06-29 DIAGNOSIS — J3089 Other allergic rhinitis: Secondary | ICD-10-CM | POA: Diagnosis not present

## 2017-06-29 DIAGNOSIS — J301 Allergic rhinitis due to pollen: Secondary | ICD-10-CM | POA: Diagnosis not present

## 2017-06-29 DIAGNOSIS — J3081 Allergic rhinitis due to animal (cat) (dog) hair and dander: Secondary | ICD-10-CM | POA: Diagnosis not present

## 2017-07-03 DIAGNOSIS — J3089 Other allergic rhinitis: Secondary | ICD-10-CM | POA: Diagnosis not present

## 2017-07-03 DIAGNOSIS — J3081 Allergic rhinitis due to animal (cat) (dog) hair and dander: Secondary | ICD-10-CM | POA: Diagnosis not present

## 2017-07-03 DIAGNOSIS — J301 Allergic rhinitis due to pollen: Secondary | ICD-10-CM | POA: Diagnosis not present

## 2017-07-06 DIAGNOSIS — J301 Allergic rhinitis due to pollen: Secondary | ICD-10-CM | POA: Diagnosis not present

## 2017-07-06 DIAGNOSIS — J3089 Other allergic rhinitis: Secondary | ICD-10-CM | POA: Diagnosis not present

## 2017-07-06 DIAGNOSIS — J3081 Allergic rhinitis due to animal (cat) (dog) hair and dander: Secondary | ICD-10-CM | POA: Diagnosis not present

## 2017-07-10 DIAGNOSIS — J301 Allergic rhinitis due to pollen: Secondary | ICD-10-CM | POA: Diagnosis not present

## 2017-07-10 DIAGNOSIS — J3089 Other allergic rhinitis: Secondary | ICD-10-CM | POA: Diagnosis not present

## 2017-07-10 DIAGNOSIS — J3081 Allergic rhinitis due to animal (cat) (dog) hair and dander: Secondary | ICD-10-CM | POA: Diagnosis not present

## 2017-07-14 DIAGNOSIS — J3081 Allergic rhinitis due to animal (cat) (dog) hair and dander: Secondary | ICD-10-CM | POA: Diagnosis not present

## 2017-07-14 DIAGNOSIS — J301 Allergic rhinitis due to pollen: Secondary | ICD-10-CM | POA: Diagnosis not present

## 2017-07-14 DIAGNOSIS — J3089 Other allergic rhinitis: Secondary | ICD-10-CM | POA: Diagnosis not present

## 2017-07-23 DIAGNOSIS — J3089 Other allergic rhinitis: Secondary | ICD-10-CM | POA: Diagnosis not present

## 2017-07-23 DIAGNOSIS — J3081 Allergic rhinitis due to animal (cat) (dog) hair and dander: Secondary | ICD-10-CM | POA: Diagnosis not present

## 2017-07-23 DIAGNOSIS — J301 Allergic rhinitis due to pollen: Secondary | ICD-10-CM | POA: Diagnosis not present

## 2017-08-05 DIAGNOSIS — J3081 Allergic rhinitis due to animal (cat) (dog) hair and dander: Secondary | ICD-10-CM | POA: Diagnosis not present

## 2017-08-05 DIAGNOSIS — J301 Allergic rhinitis due to pollen: Secondary | ICD-10-CM | POA: Diagnosis not present

## 2017-08-05 DIAGNOSIS — J3089 Other allergic rhinitis: Secondary | ICD-10-CM | POA: Diagnosis not present

## 2017-08-13 DIAGNOSIS — J3089 Other allergic rhinitis: Secondary | ICD-10-CM | POA: Diagnosis not present

## 2017-08-13 DIAGNOSIS — J301 Allergic rhinitis due to pollen: Secondary | ICD-10-CM | POA: Diagnosis not present

## 2017-08-13 DIAGNOSIS — J3081 Allergic rhinitis due to animal (cat) (dog) hair and dander: Secondary | ICD-10-CM | POA: Diagnosis not present

## 2017-08-21 DIAGNOSIS — J3081 Allergic rhinitis due to animal (cat) (dog) hair and dander: Secondary | ICD-10-CM | POA: Diagnosis not present

## 2017-08-21 DIAGNOSIS — J3089 Other allergic rhinitis: Secondary | ICD-10-CM | POA: Diagnosis not present

## 2017-08-21 DIAGNOSIS — J301 Allergic rhinitis due to pollen: Secondary | ICD-10-CM | POA: Diagnosis not present

## 2017-08-28 DIAGNOSIS — J3089 Other allergic rhinitis: Secondary | ICD-10-CM | POA: Diagnosis not present

## 2017-08-28 DIAGNOSIS — J3081 Allergic rhinitis due to animal (cat) (dog) hair and dander: Secondary | ICD-10-CM | POA: Diagnosis not present

## 2017-08-28 DIAGNOSIS — J301 Allergic rhinitis due to pollen: Secondary | ICD-10-CM | POA: Diagnosis not present

## 2017-09-14 DIAGNOSIS — J3081 Allergic rhinitis due to animal (cat) (dog) hair and dander: Secondary | ICD-10-CM | POA: Diagnosis not present

## 2017-09-14 DIAGNOSIS — J301 Allergic rhinitis due to pollen: Secondary | ICD-10-CM | POA: Diagnosis not present

## 2017-09-14 DIAGNOSIS — J3089 Other allergic rhinitis: Secondary | ICD-10-CM | POA: Diagnosis not present

## 2017-09-24 DIAGNOSIS — J3089 Other allergic rhinitis: Secondary | ICD-10-CM | POA: Diagnosis not present

## 2017-09-24 DIAGNOSIS — J301 Allergic rhinitis due to pollen: Secondary | ICD-10-CM | POA: Diagnosis not present

## 2017-09-24 DIAGNOSIS — J3081 Allergic rhinitis due to animal (cat) (dog) hair and dander: Secondary | ICD-10-CM | POA: Diagnosis not present

## 2017-10-02 DIAGNOSIS — J3081 Allergic rhinitis due to animal (cat) (dog) hair and dander: Secondary | ICD-10-CM | POA: Diagnosis not present

## 2017-10-02 DIAGNOSIS — J301 Allergic rhinitis due to pollen: Secondary | ICD-10-CM | POA: Diagnosis not present

## 2017-10-02 DIAGNOSIS — J3089 Other allergic rhinitis: Secondary | ICD-10-CM | POA: Diagnosis not present

## 2017-10-05 DIAGNOSIS — Z23 Encounter for immunization: Secondary | ICD-10-CM | POA: Diagnosis not present

## 2017-10-08 DIAGNOSIS — D3121 Benign neoplasm of right retina: Secondary | ICD-10-CM | POA: Diagnosis not present

## 2017-10-20 DIAGNOSIS — J301 Allergic rhinitis due to pollen: Secondary | ICD-10-CM | POA: Diagnosis not present

## 2017-10-20 DIAGNOSIS — J3089 Other allergic rhinitis: Secondary | ICD-10-CM | POA: Diagnosis not present

## 2017-10-20 DIAGNOSIS — J3081 Allergic rhinitis due to animal (cat) (dog) hair and dander: Secondary | ICD-10-CM | POA: Diagnosis not present

## 2017-10-28 DIAGNOSIS — J301 Allergic rhinitis due to pollen: Secondary | ICD-10-CM | POA: Diagnosis not present

## 2017-10-28 DIAGNOSIS — J3089 Other allergic rhinitis: Secondary | ICD-10-CM | POA: Diagnosis not present

## 2017-10-28 DIAGNOSIS — J3081 Allergic rhinitis due to animal (cat) (dog) hair and dander: Secondary | ICD-10-CM | POA: Diagnosis not present

## 2017-11-05 DIAGNOSIS — J3089 Other allergic rhinitis: Secondary | ICD-10-CM | POA: Diagnosis not present

## 2017-11-05 DIAGNOSIS — J3081 Allergic rhinitis due to animal (cat) (dog) hair and dander: Secondary | ICD-10-CM | POA: Diagnosis not present

## 2017-11-05 DIAGNOSIS — J301 Allergic rhinitis due to pollen: Secondary | ICD-10-CM | POA: Diagnosis not present

## 2017-11-19 DIAGNOSIS — J3081 Allergic rhinitis due to animal (cat) (dog) hair and dander: Secondary | ICD-10-CM | POA: Diagnosis not present

## 2017-11-19 DIAGNOSIS — J301 Allergic rhinitis due to pollen: Secondary | ICD-10-CM | POA: Diagnosis not present

## 2017-11-19 DIAGNOSIS — J3089 Other allergic rhinitis: Secondary | ICD-10-CM | POA: Diagnosis not present

## 2017-11-20 DIAGNOSIS — J301 Allergic rhinitis due to pollen: Secondary | ICD-10-CM | POA: Diagnosis not present

## 2017-11-23 DIAGNOSIS — J3089 Other allergic rhinitis: Secondary | ICD-10-CM | POA: Diagnosis not present

## 2017-11-23 DIAGNOSIS — J3081 Allergic rhinitis due to animal (cat) (dog) hair and dander: Secondary | ICD-10-CM | POA: Diagnosis not present

## 2017-11-27 DIAGNOSIS — J301 Allergic rhinitis due to pollen: Secondary | ICD-10-CM | POA: Diagnosis not present

## 2017-11-27 DIAGNOSIS — J3081 Allergic rhinitis due to animal (cat) (dog) hair and dander: Secondary | ICD-10-CM | POA: Diagnosis not present

## 2017-11-27 DIAGNOSIS — J3089 Other allergic rhinitis: Secondary | ICD-10-CM | POA: Diagnosis not present

## 2017-12-09 DIAGNOSIS — J3081 Allergic rhinitis due to animal (cat) (dog) hair and dander: Secondary | ICD-10-CM | POA: Diagnosis not present

## 2017-12-09 DIAGNOSIS — J3089 Other allergic rhinitis: Secondary | ICD-10-CM | POA: Diagnosis not present

## 2017-12-09 DIAGNOSIS — J301 Allergic rhinitis due to pollen: Secondary | ICD-10-CM | POA: Diagnosis not present

## 2017-12-10 ENCOUNTER — Ambulatory Visit (INDEPENDENT_AMBULATORY_CARE_PROVIDER_SITE_OTHER): Payer: Self-pay | Admitting: Family Medicine

## 2017-12-10 VITALS — BP 154/84 | HR 83 | Ht 73.5 in | Wt 256.0 lb

## 2017-12-10 DIAGNOSIS — Z029 Encounter for administrative examinations, unspecified: Secondary | ICD-10-CM

## 2017-12-10 DIAGNOSIS — Z0289 Encounter for other administrative examinations: Secondary | ICD-10-CM

## 2017-12-10 NOTE — Progress Notes (Signed)
   BP (!) 154/84   Pulse 83   Ht 6' 1.5" (1.867 m)   Wt 256 lb (116.1 kg)   BMI 33.32 kg/m    Subjective:    Patient ID: Darrell Mckinney, male    DOB: 04/22/1966, 52 y.o.   MRN: [id number]  HPI: Darrell Mckinney is a 52 y.o. male  Chief Complaint  Patient presents with  . Flight    Class 1    Relevant past medical, surgical, family and social history reviewed and updated as indicated. Interim medical history since our last visit reviewed. Allergies and medications reviewed and updated.  Review of Systems  Constitutional: Negative.   HENT: Negative.   Eyes: Negative.   Respiratory: Negative.   Cardiovascular: Negative.   Gastrointestinal: Negative.   Endocrine: Negative.   Genitourinary: Negative.   Musculoskeletal: Negative.   Skin: Negative.   Allergic/Immunologic: Negative.   Neurological: Negative.   Hematological: Negative.   Psychiatric/Behavioral: Negative.     Per HPI unless specifically indicated above     Objective:    BP (!) 154/84   Pulse 83   Ht 6' 1.5" (1.867 m)   Wt 256 lb (116.1 kg)   BMI 33.32 kg/m   Wt Readings from Last 3 Encounters:  12/10/17 256 lb (116.1 kg)  06/15/17 256 lb (116.1 kg)  10/26/15 245 lb (111.1 kg)    Physical Exam  Constitutional: He is oriented to person, place, and time. He appears well-developed and well-nourished.  HENT:  Head: Normocephalic.  Right Ear: External ear normal.  Left Ear: External ear normal.  Nose: Nose normal.  Eyes: Conjunctivae and EOM are normal. Pupils are equal, round, and reactive to light.  Neck: Normal range of motion. Neck supple. No thyromegaly present.  Cardiovascular: Normal rate, regular rhythm, normal heart sounds and intact distal pulses.  Pulmonary/Chest: Effort normal and breath sounds normal.  Abdominal: Soft. Bowel sounds are normal. There is no splenomegaly or hepatomegaly.  Genitourinary: Penis normal.  Musculoskeletal: Normal range of motion.  Lymphadenopathy:    He has  no cervical adenopathy.  Neurological: He is alert and oriented to person, place, and time. He has normal reflexes.  Skin: Skin is warm and dry.  Psychiatric: He has a normal mood and affect. His behavior is normal. Judgment and thought content normal.    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

## 2017-12-18 DIAGNOSIS — J3081 Allergic rhinitis due to animal (cat) (dog) hair and dander: Secondary | ICD-10-CM | POA: Diagnosis not present

## 2017-12-18 DIAGNOSIS — J3089 Other allergic rhinitis: Secondary | ICD-10-CM | POA: Diagnosis not present

## 2017-12-18 DIAGNOSIS — J301 Allergic rhinitis due to pollen: Secondary | ICD-10-CM | POA: Diagnosis not present

## 2017-12-21 DIAGNOSIS — J3089 Other allergic rhinitis: Secondary | ICD-10-CM | POA: Diagnosis not present

## 2017-12-21 DIAGNOSIS — J3081 Allergic rhinitis due to animal (cat) (dog) hair and dander: Secondary | ICD-10-CM | POA: Diagnosis not present

## 2017-12-21 DIAGNOSIS — J301 Allergic rhinitis due to pollen: Secondary | ICD-10-CM | POA: Diagnosis not present

## 2017-12-25 DIAGNOSIS — J301 Allergic rhinitis due to pollen: Secondary | ICD-10-CM | POA: Diagnosis not present

## 2017-12-28 DIAGNOSIS — J3081 Allergic rhinitis due to animal (cat) (dog) hair and dander: Secondary | ICD-10-CM | POA: Diagnosis not present

## 2017-12-28 DIAGNOSIS — J3089 Other allergic rhinitis: Secondary | ICD-10-CM | POA: Diagnosis not present

## 2017-12-28 DIAGNOSIS — J301 Allergic rhinitis due to pollen: Secondary | ICD-10-CM | POA: Diagnosis not present

## 2018-01-01 DIAGNOSIS — J3081 Allergic rhinitis due to animal (cat) (dog) hair and dander: Secondary | ICD-10-CM | POA: Diagnosis not present

## 2018-01-01 DIAGNOSIS — J301 Allergic rhinitis due to pollen: Secondary | ICD-10-CM | POA: Diagnosis not present

## 2018-01-01 DIAGNOSIS — J3089 Other allergic rhinitis: Secondary | ICD-10-CM | POA: Diagnosis not present

## 2018-01-05 DIAGNOSIS — J301 Allergic rhinitis due to pollen: Secondary | ICD-10-CM | POA: Diagnosis not present

## 2018-01-05 DIAGNOSIS — J3081 Allergic rhinitis due to animal (cat) (dog) hair and dander: Secondary | ICD-10-CM | POA: Diagnosis not present

## 2018-01-05 DIAGNOSIS — J3089 Other allergic rhinitis: Secondary | ICD-10-CM | POA: Diagnosis not present

## 2018-01-08 DIAGNOSIS — J3081 Allergic rhinitis due to animal (cat) (dog) hair and dander: Secondary | ICD-10-CM | POA: Diagnosis not present

## 2018-01-08 DIAGNOSIS — J3089 Other allergic rhinitis: Secondary | ICD-10-CM | POA: Diagnosis not present

## 2018-01-11 DIAGNOSIS — J3089 Other allergic rhinitis: Secondary | ICD-10-CM | POA: Diagnosis not present

## 2018-01-11 DIAGNOSIS — J301 Allergic rhinitis due to pollen: Secondary | ICD-10-CM | POA: Diagnosis not present

## 2018-01-11 DIAGNOSIS — J3081 Allergic rhinitis due to animal (cat) (dog) hair and dander: Secondary | ICD-10-CM | POA: Diagnosis not present

## 2018-01-25 DIAGNOSIS — J3089 Other allergic rhinitis: Secondary | ICD-10-CM | POA: Diagnosis not present

## 2018-01-25 DIAGNOSIS — J301 Allergic rhinitis due to pollen: Secondary | ICD-10-CM | POA: Diagnosis not present

## 2018-01-25 DIAGNOSIS — J3081 Allergic rhinitis due to animal (cat) (dog) hair and dander: Secondary | ICD-10-CM | POA: Diagnosis not present

## 2018-02-01 DIAGNOSIS — J3081 Allergic rhinitis due to animal (cat) (dog) hair and dander: Secondary | ICD-10-CM | POA: Diagnosis not present

## 2018-02-01 DIAGNOSIS — J301 Allergic rhinitis due to pollen: Secondary | ICD-10-CM | POA: Diagnosis not present

## 2018-02-01 DIAGNOSIS — J3089 Other allergic rhinitis: Secondary | ICD-10-CM | POA: Diagnosis not present

## 2018-02-02 DIAGNOSIS — M9902 Segmental and somatic dysfunction of thoracic region: Secondary | ICD-10-CM | POA: Diagnosis not present

## 2018-02-02 DIAGNOSIS — M9903 Segmental and somatic dysfunction of lumbar region: Secondary | ICD-10-CM | POA: Diagnosis not present

## 2018-02-02 DIAGNOSIS — M545 Low back pain: Secondary | ICD-10-CM | POA: Diagnosis not present

## 2018-02-04 ENCOUNTER — Encounter (INDEPENDENT_AMBULATORY_CARE_PROVIDER_SITE_OTHER): Payer: Self-pay

## 2018-02-04 ENCOUNTER — Encounter (INDEPENDENT_AMBULATORY_CARE_PROVIDER_SITE_OTHER): Payer: 59

## 2018-02-08 ENCOUNTER — Encounter (INDEPENDENT_AMBULATORY_CARE_PROVIDER_SITE_OTHER): Payer: Self-pay | Admitting: Family Medicine

## 2018-02-08 ENCOUNTER — Ambulatory Visit (INDEPENDENT_AMBULATORY_CARE_PROVIDER_SITE_OTHER): Payer: 59 | Admitting: Family Medicine

## 2018-02-08 VITALS — BP 129/78 | HR 65 | Temp 97.5°F | Ht 73.0 in | Wt 251.0 lb

## 2018-02-08 DIAGNOSIS — Z9189 Other specified personal risk factors, not elsewhere classified: Secondary | ICD-10-CM

## 2018-02-08 DIAGNOSIS — Z1331 Encounter for screening for depression: Secondary | ICD-10-CM | POA: Diagnosis not present

## 2018-02-08 DIAGNOSIS — R0602 Shortness of breath: Secondary | ICD-10-CM | POA: Diagnosis not present

## 2018-02-08 DIAGNOSIS — Z6833 Body mass index (BMI) 33.0-33.9, adult: Secondary | ICD-10-CM

## 2018-02-08 DIAGNOSIS — E66811 Obesity, class 1: Secondary | ICD-10-CM

## 2018-02-08 DIAGNOSIS — K219 Gastro-esophageal reflux disease without esophagitis: Secondary | ICD-10-CM | POA: Diagnosis not present

## 2018-02-08 DIAGNOSIS — R5383 Other fatigue: Secondary | ICD-10-CM | POA: Insufficient documentation

## 2018-02-08 DIAGNOSIS — E669 Obesity, unspecified: Secondary | ICD-10-CM | POA: Diagnosis not present

## 2018-02-08 DIAGNOSIS — Z0289 Encounter for other administrative examinations: Secondary | ICD-10-CM

## 2018-02-09 LAB — CBC WITH DIFFERENTIAL
Basophils Absolute: 0 10*3/uL (ref 0.0–0.2)
Basos: 0 %
EOS (ABSOLUTE): 0.1 10*3/uL (ref 0.0–0.4)
Eos: 1 %
Hematocrit: 46.3 % (ref 37.5–51.0)
Hemoglobin: 15.6 g/dL (ref 13.0–17.7)
Immature Grans (Abs): 0 10*3/uL (ref 0.0–0.1)
Immature Granulocytes: 0 %
Lymphocytes Absolute: 1.8 10*3/uL (ref 0.7–3.1)
Lymphs: 33 %
MCH: 33.1 pg — ABNORMAL HIGH (ref 26.6–33.0)
MCHC: 33.7 g/dL (ref 31.5–35.7)
MCV: 98 fL — ABNORMAL HIGH (ref 79–97)
Monocytes Absolute: 0.6 10*3/uL (ref 0.1–0.9)
Monocytes: 11 %
Neutrophils Absolute: 3 10*3/uL (ref 1.4–7.0)
Neutrophils: 55 %
RBC: 4.71 x10E6/uL (ref 4.14–5.80)
RDW: 12.7 % (ref 12.3–15.4)
WBC: 5.5 10*3/uL (ref 3.4–10.8)

## 2018-02-09 LAB — T4, FREE: Free T4: 1.2 ng/dL (ref 0.82–1.77)

## 2018-02-09 LAB — COMPREHENSIVE METABOLIC PANEL
ALT: 54 IU/L — ABNORMAL HIGH (ref 0–44)
AST: 35 IU/L (ref 0–40)
Albumin/Globulin Ratio: 2 (ref 1.2–2.2)
Albumin: 4.5 g/dL (ref 3.5–5.5)
Alkaline Phosphatase: 52 IU/L (ref 39–117)
BUN/Creatinine Ratio: 13 (ref 9–20)
BUN: 15 mg/dL (ref 6–24)
Bilirubin Total: 0.3 mg/dL (ref 0.0–1.2)
CO2: 24 mmol/L (ref 20–29)
Calcium: 9.3 mg/dL (ref 8.7–10.2)
Chloride: 99 mmol/L (ref 96–106)
Creatinine, Ser: 1.14 mg/dL (ref 0.76–1.27)
GFR calc Af Amer: 86 mL/min/{1.73_m2} (ref >59)
GFR calc non Af Amer: 75 mL/min/{1.73_m2} (ref >59)
Globulin, Total: 2.2 g/dL (ref 1.5–4.5)
Glucose: 103 mg/dL — ABNORMAL HIGH (ref 65–99)
Potassium: 4.3 mmol/L (ref 3.5–5.2)
Sodium: 141 mmol/L (ref 134–144)
Total Protein: 6.7 g/dL (ref 6.0–8.5)

## 2018-02-09 LAB — VITAMIN D 25 HYDROXY (VIT D DEFICIENCY, FRACTURES): Vit D, 25-Hydroxy: 23.3 ng/mL — ABNORMAL LOW (ref 30.0–100.0)

## 2018-02-09 LAB — INSULIN, RANDOM: INSULIN: 18.8 u[IU]/mL (ref 2.6–24.9)

## 2018-02-09 LAB — LIPID PANEL WITH LDL/HDL RATIO
Cholesterol, Total: 137 mg/dL (ref 100–199)
HDL: 35 mg/dL — ABNORMAL LOW (ref >39)
LDL Calculated: 59 mg/dL (ref 0–99)
LDl/HDL Ratio: 1.7 ratio (ref 0.0–3.6)
Triglycerides: 215 mg/dL — ABNORMAL HIGH (ref 0–149)
VLDL Cholesterol Cal: 43 mg/dL — ABNORMAL HIGH (ref 5–40)

## 2018-02-09 LAB — TSH: TSH: 1.74 u[IU]/mL (ref 0.450–4.500)

## 2018-02-09 LAB — HEMOGLOBIN A1C
Est. average glucose Bld gHb Est-mCnc: 100 mg/dL
Hgb A1c MFr Bld: 5.1 % (ref 4.8–5.6)

## 2018-02-09 LAB — VITAMIN B12: Vitamin B-12: 531 pg/mL (ref 232–1245)

## 2018-02-09 LAB — FOLATE: Folate: >20 ng/mL (ref >3.0)

## 2018-02-09 LAB — T3: T3, Total: 101 ng/dL (ref 71–180)

## 2018-02-09 NOTE — Progress Notes (Signed)
.  Office: [phone]  /  Fax: [phone]   HPI:   Chief Complaint: OBESITY  Darrell Mckinney (MR# [id number]) is a 52 y.o. male who presents on 02/08/2018 for obesity evaluation and treatment. Current BMI is Body mass index is 33.12 kg/m.Darrell Mckinney has struggled with obesity for years and has been unsuccessful in either losing weight or maintaining long term weight loss. Darrell Mckinney attended our information session and states he is currently in the action stage of change and ready to dedicate time achieving and maintaining a healthier weight.   Darrell Mckinney states his family eats meals together his desired weight loss is 36 lbs he started gaining weight at 43 (8 years ago) his heaviest weight ever was 254 lbs he has significant food cravings issues  he skips meals frequently he is frequently drinking liquids with calories he frequently makes poor food choices he frequently eats larger portions than normal    Fatigue Darrell Mckinney feels his energy is lower than it should be. This has worsened with weight gain and has not worsened recently. Darrell Mckinney admits to daytime somnolence and  admits to waking up still tired. Patient is at risk for obstructive sleep apnea. Darrell Mckinney has a history of symptoms of daytime fatigue. Patient generally gets 7 hours of sleep per night, and states they generally have generally restful sleep. Snoring is present. Apneic episodes are not present. Epworth Sleepiness Score is 5.  Dyspnea on exertion Darrell Mckinney notes increasing shortness of breath with exercising and seems to be worsening over time with weight gain. He notes getting out of breath sooner with activity than he used to. This has not gotten worse recently. Darrell Mckinney denies orthopnea.  GERD Darrell Mckinney is on Protonix, he admits he does drink "tons" of beer and often eats large meals.  At risk for cardiovascular disease Darrell Mckinney is at a higher than average risk for cardiovascular disease due to obesity. He currently denies any  chest pain.  Depression Screen Darrell Mckinney's Food and Mood (modified PHQ-9) score was  Depression screen PHQ 2/9 02/08/2018  Decreased Interest 0  Down, Depressed, Hopeless 0  PHQ - 2 Score 0  Altered sleeping 1  Tired, decreased energy 1  Change in appetite 0  Feeling bad or failure about yourself  0  Trouble concentrating 0  Moving slowly or fidgety/restless 1  Suicidal thoughts 0  PHQ-9 Score 3  Difficult doing work/chores Not difficult at all    ALLERGIES: No Known Allergies  MEDICATIONS: Current Outpatient Medications on File Prior to Visit  Medication Sig Dispense Refill  . B Complex Vitamins (VITAMIN B COMPLEX PO) Take 1 tablet by mouth daily.    Marland Kitchen ibuprofen (ADVIL,MOTRIN) 800 MG tablet Take 800 mg by mouth every 6 (six) hours as needed.     Marland Kitchen levocetirizine (XYZAL) 5 MG tablet Take 5 mg by mouth every evening.     . montelukast (SINGULAIR) 10 MG tablet Take 10 mg by mouth daily.     . pantoprazole (PROTONIX) 40 MG tablet Take 40 mg by mouth daily.     No current facility-administered medications on file prior to visit.     PAST MEDICAL HISTORY: Past Medical History:  Diagnosis Date  . Acid reflux   . Back pain   . Bulging lumbar disc   . Fatigue   . GERD (gastroesophageal reflux disease)   . Hay fever   . Joint pain   . Muscle pain   . Sciatic pain   . Seasonal allergies  PAST SURGICAL HISTORY: Past Surgical History:  Procedure Laterality Date  . BACK SURGERY  ~2006  . COLONOSCOPY  12/2008   RMR: normal  . COLONOSCOPY N/A 10/26/2015   Procedure: COLONOSCOPY;  Surgeon: Daneil Dolin, MD;  Location: [facility];  Service: Endoscopy;  Laterality: N/A;  11:15 - moved to 12:00 - pt knows to arrive at 11:00  . TURBINATE RESECTION Bilateral 01/25/2013   Procedure: BILATERAL TURBINATE RESECTION;  Surgeon: Ascencion Dike, MD;  Location: [place] [facility];  Service: ENT;  Laterality: Bilateral;    SOCIAL HISTORY: Social History   Tobacco Use  . Smoking  status: Never Smoker  . Smokeless tobacco: Never Used  Substance Use Topics  . Alcohol use: Yes    Alcohol/week: 0.0 oz    Comment: six pack per week  . Drug use: No    FAMILY HISTORY: Family History  Problem Relation Age of Onset  . Colon cancer Father        dx age 52, deceased age 20  . Colon cancer Paternal Uncle        age 50s  . Stroke Mother     ROS: Review of Systems  Constitutional: Positive for malaise/fatigue. Negative for weight loss.  HENT:       + Decrease in hearing + Hay fever  Respiratory: Positive for shortness of breath (with exertion).   Cardiovascular: Negative for chest pain and orthopnea.  Musculoskeletal: Positive for back pain.       + Muscle or joint pain  Psychiatric/Behavioral: Positive for depression. Negative for suicidal ideas.    PHYSICAL EXAM: Blood pressure 129/78, pulse 65, temperature (!) 97.5 F (36.4 C), temperature source Oral, height 6\' 1"  (1.854 m), weight 251 lb (113.9 kg), SpO2 99 %. Body mass index is 33.12 kg/m. Physical Exam  Constitutional: He is oriented to person, place, and time. He appears well-developed and well-nourished.  HENT:  Head: Normocephalic and atraumatic.  Nose: Nose normal.  Eyes: EOM are normal. No scleral icterus.  Neck: Normal range of motion. Neck supple. No thyromegaly present.  Cardiovascular: Normal rate and regular rhythm.  Pulmonary/Chest: Effort normal. No respiratory distress.  Abdominal: Soft. There is no tenderness.  + Obesity  Musculoskeletal:  Range of Motion normal in all 4 extremities Trace edema noted in bilateral lower extremities  Neurological: He is alert and oriented to person, place, and time. Coordination normal.  Skin: Skin is warm and dry.  Psychiatric: He has a normal mood and affect. His behavior is normal.  Vitals reviewed.   RECENT LABS AND TESTS: BMET    Component Value Date/Time   NA 141 02/08/2018 0943   K 4.3 02/08/2018 0943   CL 99 02/08/2018 0943   CO2 24  02/08/2018 0943   GLUCOSE 103 (H) 02/08/2018 0943   BUN 15 02/08/2018 0943   CREATININE 1.14 02/08/2018 0943   CALCIUM 9.3 02/08/2018 0943   GFRNONAA 75 02/08/2018 0943   GFRAA 86 02/08/2018 0943   Lab Results  Component Value Date   HGBA1C 5.1 02/08/2018   Lab Results  Component Value Date   INSULIN 18.8 02/08/2018   CBC    Component Value Date/Time   WBC 5.5 02/08/2018 0943   RBC 4.71 02/08/2018 0943   HGB 15.6 02/08/2018 0943   HCT 46.3 02/08/2018 0943   MCV 98 (H) 02/08/2018 0943   MCH 33.1 (H) 02/08/2018 0943   MCHC 33.7 02/08/2018 0943   RDW 12.7 02/08/2018 0943   LYMPHSABS 1.8  02/08/2018 0943   EOSABS 0.1 02/08/2018 0943   BASOSABS 0.0 02/08/2018 0943   Iron/TIBC/Ferritin/ %Sat No results found for: IRON, TIBC, FERRITIN, IRONPCTSAT Lipid Panel     Component Value Date/Time   CHOL 137 02/08/2018 0943   TRIG 215 (H) 02/08/2018 0943   HDL 35 (L) 02/08/2018 0943   LDLCALC 59 02/08/2018 0943   Hepatic Function Panel     Component Value Date/Time   PROT 6.7 02/08/2018 0943   ALBUMIN 4.5 02/08/2018 0943   AST 35 02/08/2018 0943   ALT 54 (H) 02/08/2018 0943   ALKPHOS 52 02/08/2018 0943   BILITOT 0.3 02/08/2018 0943      Component Value Date/Time   TSH 1.740 02/08/2018 0943   Vitamin D No recent labs  ECG  shows NSR with a rate of 60 BPM INDIRECT CALORIMETER done today shows a VO2 of 296 and a REE of 2062. His calculated basal metabolic rate is 5284 thus his basal metabolic rate is worse than expected.    ASSESSMENT AND PLAN: Other fatigue - Plan: EKG 12-Lead, Vitamin B12, CBC With Differential, Comprehensive metabolic panel, Folate, Hemoglobin A1c, Insulin, random, Lipid Panel With LDL/HDL Ratio, VITAMIN D 25 Hydroxy (Vit-D Deficiency, Fractures), TSH, T4, free, T3  Shortness of breath on exertion - Plan: CBC With Differential  Gastroesophageal reflux disease without esophagitis - Plan: Comprehensive metabolic panel  Depression screening  At  risk for heart disease  Class 1 obesity with serious comorbidity and body mass index (BMI) of 33.0 to 33.9 in adult, unspecified obesity type  PLAN:  Fatigue Caidyn was informed that his fatigue may be related to obesity, depression or many other causes. Labs will be ordered, and in the meanwhile Darrell Mckinney has agreed to work on diet, exercise and weight loss to help with fatigue. Proper sleep hygiene was discussed including the need for 7-8 hours of quality sleep each night. A sleep study was not ordered based on symptoms and Epworth score.  Dyspnea on exertion Darrell Mckinney's shortness of breath appears to be obesity related and exercise induced. He has agreed to work on weight loss and gradually increase exercise to treat his exercise induced shortness of breath. If Darrell Mckinney follows our instructions and loses weight without improvement of his shortness of breath, we will plan to refer to pulmonology. We will monitor this condition regularly. Darrell Mckinney agrees to this plan.  GERD Arhan will continue Protonix and will work on decreasing ETOH and continue diet, exercise, and weight loss. Darrell Mckinney agrees to follow up with our clinic in 2 weeks.  Cardiovascular risk counselling Darrell Mckinney was given extended (15 minutes) coronary artery disease prevention counseling today. He is 52 y.o. male and has risk factors for heart disease including obesity. We discussed intensive lifestyle modifications today with an emphasis on specific weight loss instructions and strategies. Pt was also informed of the importance of increasing exercise and decreasing saturated fats to help prevent heart disease.  Depression Screen Darrell Mckinney had a negative depression screening. Depression is commonly associated with obesity and often results in emotional eating behaviors. We will monitor this closely and work on CBT to help improve the non-hunger eating patterns. Referral to Psychology may be required if no improvement is seen as he  continues in our clinic.  Obesity Darrell Mckinney is currently in the action stage of change and his goal is to continue with weight loss efforts He has agreed to follow a lower carbohydrate, vegetable and lean protein rich diet plan and follow the Category 3 plan Darrell Mckinney  has been instructed to work up to a goal of 150 minutes of combined cardio and strengthening exercise per week for weight loss and overall health benefits. We discussed the following Behavioral Modification Strategies today: increasing lean protein intake and decreasing simple carbohydrates  Due to Darrell Mckinney irregular schedule, will do low carbohydrate plan while traveling and Category 3 plan while at home.  Darrell Mckinney has agreed to follow up with our clinic in 2 weeks. He was informed of the importance of frequent follow up visits to maximize his success with intensive lifestyle modifications for his multiple health conditions. He was informed we would discuss his lab results at his next visit unless there is a critical issue that needs to be addressed sooner. Kie agreed to keep his next visit at the agreed upon time to discuss these results.    OBESITY BEHAVIORAL INTERVENTION VISIT  Today's visit was # 1 out of 22.  Starting weight: 251 lbs Starting date: 02/08/18 Today's weight : 251 lbs Today's date: 02/08/2018 Total lbs lost to date: 0 (Patients must lose 7 lbs in the first 6 months to continue with counseling)   ASK: We discussed the diagnosis of obesity with [name] today and Darrell Mckinney agreed to give [name] permission to discuss obesity behavioral modification therapy today.  ASSESS: Alanzo has the diagnosis of obesity and his BMI today is 33.12 Spike is in the action stage of change   ADVISE: Aziah was educated on the multiple health risks of obesity as well as the benefit of weight loss to improve his health. He was advised of the need for long term treatment and the importance of lifestyle  modifications.  AGREE: Multiple dietary modification options and treatment options were discussed and  Audry agreed to the above obesity treatment plan.   I, Trixie Dredge, am acting as transcriptionist for Dennard Nip, MD  I have reviewed the above documentation for accuracy and completeness, and I agree with the above. -Dennard Nip, MD

## 2018-02-10 DIAGNOSIS — J301 Allergic rhinitis due to pollen: Secondary | ICD-10-CM | POA: Diagnosis not present

## 2018-02-10 DIAGNOSIS — J3081 Allergic rhinitis due to animal (cat) (dog) hair and dander: Secondary | ICD-10-CM | POA: Diagnosis not present

## 2018-02-10 DIAGNOSIS — J3089 Other allergic rhinitis: Secondary | ICD-10-CM | POA: Diagnosis not present

## 2018-02-25 DIAGNOSIS — J3081 Allergic rhinitis due to animal (cat) (dog) hair and dander: Secondary | ICD-10-CM | POA: Diagnosis not present

## 2018-02-25 DIAGNOSIS — J301 Allergic rhinitis due to pollen: Secondary | ICD-10-CM | POA: Diagnosis not present

## 2018-02-25 DIAGNOSIS — J3089 Other allergic rhinitis: Secondary | ICD-10-CM | POA: Diagnosis not present

## 2018-02-26 DIAGNOSIS — M9903 Segmental and somatic dysfunction of lumbar region: Secondary | ICD-10-CM | POA: Diagnosis not present

## 2018-02-26 DIAGNOSIS — M5137 Other intervertebral disc degeneration, lumbosacral region: Secondary | ICD-10-CM | POA: Diagnosis not present

## 2018-02-26 DIAGNOSIS — M5442 Lumbago with sciatica, left side: Secondary | ICD-10-CM | POA: Diagnosis not present

## 2018-03-01 ENCOUNTER — Ambulatory Visit (INDEPENDENT_AMBULATORY_CARE_PROVIDER_SITE_OTHER): Payer: 59 | Admitting: Family Medicine

## 2018-03-01 VITALS — BP 125/73 | HR 79 | Temp 97.4°F | Ht 73.0 in | Wt 241.0 lb

## 2018-03-01 DIAGNOSIS — E88819 Insulin resistance, unspecified: Secondary | ICD-10-CM

## 2018-03-01 DIAGNOSIS — E559 Vitamin D deficiency, unspecified: Secondary | ICD-10-CM

## 2018-03-01 DIAGNOSIS — Z9189 Other specified personal risk factors, not elsewhere classified: Secondary | ICD-10-CM

## 2018-03-01 DIAGNOSIS — E8881 Metabolic syndrome: Secondary | ICD-10-CM | POA: Diagnosis not present

## 2018-03-01 DIAGNOSIS — Z6831 Body mass index (BMI) 31.0-31.9, adult: Secondary | ICD-10-CM

## 2018-03-01 DIAGNOSIS — E669 Obesity, unspecified: Secondary | ICD-10-CM

## 2018-03-01 DIAGNOSIS — M5137 Other intervertebral disc degeneration, lumbosacral region: Secondary | ICD-10-CM | POA: Diagnosis not present

## 2018-03-01 DIAGNOSIS — M5442 Lumbago with sciatica, left side: Secondary | ICD-10-CM | POA: Diagnosis not present

## 2018-03-01 DIAGNOSIS — M9903 Segmental and somatic dysfunction of lumbar region: Secondary | ICD-10-CM | POA: Diagnosis not present

## 2018-03-01 MED ORDER — VITAMIN D (ERGOCALCIFEROL) 1.25 MG (50000 UNIT) PO CAPS: 50000.0000 [IU] | ORAL_CAPSULE | ORAL | 0 refills | Status: DC

## 2018-03-01 NOTE — Progress Notes (Signed)
Office: [phone]  /  Fax: [phone]   HPI:   Chief Complaint: OBESITY Darrell Mckinney is here to discuss his progress with his obesity treatment plan. He is on the lower carbohydrate, vegetable and lean protein rich diet plan or follow the Category 3 plan and is following his eating plan approximately 30 % of the time. He states he is doing cardio and weights for 30-45 minutes 3-4 times per week. Darrell Mckinney has struggled to follow his plan due to increased traveling and long hours. He worked to decrease simple carbohydrates and increase walking especially while traveling.  His weight is 241 lb (109.3 kg) today and has had a weight loss of 10 pounds over a period of 3 weeks since his last visit. He has lost 10 lbs since starting treatment with [name].  Vitamin D Deficiency Darrell Mckinney has a new diagnosis of vitamin D deficiency. He is not on Vit D, He notes fatigue and denies nausea, vomiting or muscle weakness.  Insulin Resistance Darrell Mckinney has a new diagnosis of insulin resistance based on his elevated fasting insulin level >5. Fasting insulin elevated at 18.8 and he notes polyphagia. Although Darrell Mckinney's blood glucose readings are still under good control, insulin resistance puts him at greater risk of metabolic syndrome and diabetes. He is not taking metformin currently and continues to work on diet and exercise to decrease risk of diabetes.  At risk for diabetes Darrell Mckinney is at higher than average risk for developing diabetes due to his obesity and insulin resistance. He currently denies polyuria or polydipsia.  ALLERGIES: No Known Allergies  MEDICATIONS: Current Outpatient Medications on File Prior to Visit  Medication Sig Dispense Refill  . B Complex Vitamins (VITAMIN B COMPLEX PO) Take 1 tablet by mouth daily.    Marland Kitchen ibuprofen (ADVIL,MOTRIN) 800 MG tablet Take 800 mg by mouth every 6 (six) hours as needed.     Marland Kitchen levocetirizine (XYZAL) 5 MG tablet Take 5 mg by mouth every evening.     . montelukast  (SINGULAIR) 10 MG tablet Take 10 mg by mouth daily.     . pantoprazole (PROTONIX) 40 MG tablet Take 40 mg by mouth daily.     No current facility-administered medications on file prior to visit.     PAST MEDICAL HISTORY: Past Medical History:  Diagnosis Date  . Acid reflux   . Back pain   . Bulging lumbar disc   . Fatigue   . GERD (gastroesophageal reflux disease)   . Hay fever   . Joint pain   . Muscle pain   . Sciatic pain   . Seasonal allergies     PAST SURGICAL HISTORY: Past Surgical History:  Procedure Laterality Date  . BACK SURGERY  ~2006  . COLONOSCOPY  12/2008   RMR: normal  . COLONOSCOPY N/A 10/26/2015   Procedure: COLONOSCOPY;  Surgeon: Daneil Dolin, MD;  [facility];  Service: Endoscopy;  Laterality: N/A;  11:15 - moved to 12:00 - pt knows to arrive at 11:00  . TURBINATE RESECTION Bilateral 01/25/2013   Procedure: BILATERAL TURBINATE RESECTION;  Surgeon: Ascencion Dike, MD;  Location: [facility];  Service: ENT;  Laterality: Bilateral;    SOCIAL HISTORY: Social History   Tobacco Use  . Smoking status: Never Smoker  . Smokeless tobacco: Never Used  Substance Use Topics  . Alcohol use: Yes    Alcohol/week: 0.0 oz    Comment: six pack per week  . Drug use: No    FAMILY HISTORY:  Family History  Problem Relation Age of Onset  . Colon cancer Father        dx age 12, deceased age 64  . Colon cancer Paternal Uncle        age 38s  . Stroke Mother     ROS: Review of Systems  Constitutional: Positive for malaise/fatigue and weight loss.  Gastrointestinal: Negative for nausea and vomiting.  Genitourinary: Negative for frequency.  Musculoskeletal:       Negative muscle weakness  Endo/Heme/Allergies: Negative for polydipsia.       Positive polyphagia    PHYSICAL EXAM: Blood pressure 125/73, pulse 79, temperature (!) 97.4 F (36.3 C), temperature source Oral, height 6\' 1"  (1.854 m), weight 241 lb (109.3 kg), SpO2 96 %. Body mass  index is 31.8 kg/m. Physical Exam  Constitutional: He is oriented to person, place, and time. He appears well-developed and well-nourished.  Cardiovascular: Normal rate.  Pulmonary/Chest: Effort normal.  Musculoskeletal: Normal range of motion.  Neurological: He is oriented to person, place, and time.  Skin: Skin is warm and dry.  Psychiatric: He has a normal mood and affect. His behavior is normal.  Vitals reviewed.   RECENT LABS AND TESTS: BMET    Component Value Date/Time   NA 141 02/08/2018 0943   K 4.3 02/08/2018 0943   CL 99 02/08/2018 0943   CO2 24 02/08/2018 0943   GLUCOSE 103 (H) 02/08/2018 0943   BUN 15 02/08/2018 0943   CREATININE 1.14 02/08/2018 0943   CALCIUM 9.3 02/08/2018 0943   GFRNONAA 75 02/08/2018 0943   GFRAA 86 02/08/2018 0943   Lab Results  Component Value Date   HGBA1C 5.1 02/08/2018   Lab Results  Component Value Date   INSULIN 18.8 02/08/2018   CBC    Component Value Date/Time   WBC 5.5 02/08/2018 0943   RBC 4.71 02/08/2018 0943   HGB 15.6 02/08/2018 0943   HCT 46.3 02/08/2018 0943   MCV 98 (H) 02/08/2018 0943   MCH 33.1 (H) 02/08/2018 0943   MCHC 33.7 02/08/2018 0943   RDW 12.7 02/08/2018 0943   LYMPHSABS 1.8 02/08/2018 0943   EOSABS 0.1 02/08/2018 0943   BASOSABS 0.0 02/08/2018 0943   Iron/TIBC/Ferritin/ %Sat No results found for: IRON, TIBC, FERRITIN, IRONPCTSAT Lipid Panel     Component Value Date/Time   CHOL 137 02/08/2018 0943   TRIG 215 (H) 02/08/2018 0943   HDL 35 (L) 02/08/2018 0943   LDLCALC 59 02/08/2018 0943   Hepatic Function Panel     Component Value Date/Time   PROT 6.7 02/08/2018 0943   ALBUMIN 4.5 02/08/2018 0943   AST 35 02/08/2018 0943   ALT 54 (H) 02/08/2018 0943   ALKPHOS 52 02/08/2018 0943   BILITOT 0.3 02/08/2018 0943      Component Value Date/Time   TSH 1.740 02/08/2018 0943  Results for ERICA, OSUNA (MRN [id number]) as of 03/01/2018 17:32  Ref. Range 02/08/2018 09:43  Vitamin D, 25-Hydroxy  Latest Ref Range: 30.0 - 100.0 ng/mL 23.3 (L)    ASSESSMENT AND PLAN: Vitamin D deficiency - Plan: Vitamin D, Ergocalciferol, (DRISDOL) 50000 units CAPS capsule  Insulin resistance  At risk for diabetes mellitus  Class 1 obesity with serious comorbidity and body mass index (BMI) of 31.0 to 31.9 in adult, unspecified obesity type  PLAN:  Vitamin D Deficiency Darrell Mckinney was informed that low vitamin D levels contributes to fatigue and are associated with obesity, breast, and colon cancer. Darrell Mckinney agrees to start prescription Vit D @  50,000 IU every week #4 with no refills. He will follow up for routine testing of vitamin D, at least 2-3 times per year. He was informed of the risk of over-replacement of vitamin D and agrees to not increase his dose unless he discusses this with [name] first. Darrell Mckinney agrees to follow up with our clinic in 2 to 3 weeks.  Insulin Resistance Darrell Mckinney will continue to work on weight loss, diet, exercise, and decreasing simple carbohydrates in his diet to help decrease the risk of diabetes. We dicussed metformin including benefits and risks. He was informed that eating too many simple carbohydrates or too many calories at one sitting increases the likelihood of GI side effects. Darrell Mckinney declined metformin for now and prescription was not written today. We will recheck labs in 3 months, handout given, may need to start metformin if polyphagia increases. Darrell Mckinney agrees to follow up with our clinic in 2 to 3 weeks as directed to monitor his progress.  Diabetes risk counselling Darrell Mckinney was given extended (30 minutes) diabetes prevention counseling today. He is 52 y.o. male and has risk factors for diabetes including obesity and insulin resistance. We discussed intensive lifestyle modifications today with an emphasis on weight loss as well as increasing exercise and decreasing simple carbohydrates in his diet.  Obesity Darrell Mckinney is currently in the action stage of change. As such,  his goal is to continue with weight loss efforts He has agreed to follow a lower carbohydrate, vegetable and lean protein rich diet plan while traveling or follow the Category 3 plan Darrell Mckinney has been instructed to work up to a goal of 150 minutes of combined cardio and strengthening exercise per week for weight loss and overall health benefits. We discussed the following Behavioral Modification Strategies today: decreasing simple carbohydrates, increasing vegetables, work on meal planning and easy cooking plans, and travel eating strategies   Darrell Mckinney has agreed to follow up with our clinic in 2 to 3 weeks. He was informed of the importance of frequent follow up visits to maximize his success with intensive lifestyle modifications for his multiple health conditions.   OBESITY BEHAVIORAL INTERVENTION VISIT  Today's visit was # 2 out of 22.  Starting weight: 251 lbs Starting date: 02/08/18 Today's weight : 241 lbs  Today's date: 03/01/2018 Total lbs lost to date: 10 (Patients must lose 7 lbs in the first 6 months to continue with counseling)   ASK: We discussed the diagnosis of obesity with [name] today and Darrell Mckinney agreed to give [name] permission to discuss obesity behavioral modification therapy today.  ASSESS: Arvis has the diagnosis of obesity and his BMI today is 31.8 Kameryn is in the action stage of change   ADVISE: Pearly was educated on the multiple health risks of obesity as well as the benefit of weight loss to improve his health. He was advised of the need for long term treatment and the importance of lifestyle modifications.  AGREE: Multiple dietary modification options and treatment options were discussed and  Nnamdi agreed to the above obesity treatment plan.  I, Trixie Dredge, am acting as transcriptionist for Dennard Nip, MD  I have reviewed the above documentation for accuracy and completeness, and I agree with the above. -Dennard Nip, MD

## 2018-03-02 ENCOUNTER — Telehealth (INDEPENDENT_AMBULATORY_CARE_PROVIDER_SITE_OTHER): Payer: Self-pay | Admitting: Family Medicine

## 2018-03-02 DIAGNOSIS — J3081 Allergic rhinitis due to animal (cat) (dog) hair and dander: Secondary | ICD-10-CM | POA: Diagnosis not present

## 2018-03-02 DIAGNOSIS — J301 Allergic rhinitis due to pollen: Secondary | ICD-10-CM | POA: Diagnosis not present

## 2018-03-02 DIAGNOSIS — J3089 Other allergic rhinitis: Secondary | ICD-10-CM | POA: Diagnosis not present

## 2018-03-02 NOTE — Telephone Encounter (Signed)
Tor is requesting that [facility] [address]. He stated that his [facility]) sent [facility].  [facility]. Thank you. Maudie Mercury

## 2018-03-02 NOTE — Telephone Encounter (Signed)
The correction was made in his chart. April, [place]

## 2018-03-05 DIAGNOSIS — M5442 Lumbago with sciatica, left side: Secondary | ICD-10-CM | POA: Diagnosis not present

## 2018-03-05 DIAGNOSIS — M5137 Other intervertebral disc degeneration, lumbosacral region: Secondary | ICD-10-CM | POA: Diagnosis not present

## 2018-03-05 DIAGNOSIS — M9903 Segmental and somatic dysfunction of lumbar region: Secondary | ICD-10-CM | POA: Diagnosis not present

## 2018-03-22 ENCOUNTER — Other Ambulatory Visit (INDEPENDENT_AMBULATORY_CARE_PROVIDER_SITE_OTHER): Payer: Self-pay | Admitting: Family Medicine

## 2018-03-22 ENCOUNTER — Ambulatory Visit (INDEPENDENT_AMBULATORY_CARE_PROVIDER_SITE_OTHER): Payer: 59 | Admitting: Family Medicine

## 2018-03-22 ENCOUNTER — Encounter (INDEPENDENT_AMBULATORY_CARE_PROVIDER_SITE_OTHER): Payer: Self-pay | Admitting: Family Medicine

## 2018-03-22 VITALS — BP 123/76 | HR 57 | Temp 98.4°F | Ht 73.0 in | Wt 242.0 lb

## 2018-03-22 DIAGNOSIS — Z6832 Body mass index (BMI) 32.0-32.9, adult: Secondary | ICD-10-CM | POA: Diagnosis not present

## 2018-03-22 DIAGNOSIS — J3089 Other allergic rhinitis: Secondary | ICD-10-CM | POA: Diagnosis not present

## 2018-03-22 DIAGNOSIS — E669 Obesity, unspecified: Secondary | ICD-10-CM

## 2018-03-22 DIAGNOSIS — E559 Vitamin D deficiency, unspecified: Secondary | ICD-10-CM

## 2018-03-22 DIAGNOSIS — Z9189 Other specified personal risk factors, not elsewhere classified: Secondary | ICD-10-CM | POA: Diagnosis not present

## 2018-03-22 DIAGNOSIS — J301 Allergic rhinitis due to pollen: Secondary | ICD-10-CM | POA: Diagnosis not present

## 2018-03-22 DIAGNOSIS — J3081 Allergic rhinitis due to animal (cat) (dog) hair and dander: Secondary | ICD-10-CM | POA: Diagnosis not present

## 2018-03-22 MED ORDER — VITAMIN D (ERGOCALCIFEROL) 1.25 MG (50000 UNIT) PO CAPS: 50000.0000 [IU] | ORAL_CAPSULE | ORAL | 0 refills | Status: DC

## 2018-03-23 ENCOUNTER — Encounter: Payer: Self-pay | Admitting: Family Medicine

## 2018-03-23 ENCOUNTER — Ambulatory Visit (INDEPENDENT_AMBULATORY_CARE_PROVIDER_SITE_OTHER): Payer: 59 | Admitting: Family Medicine

## 2018-03-23 VITALS — BP 122/88 | Ht 73.0 in | Wt 247.0 lb

## 2018-03-23 DIAGNOSIS — Z0000 Encounter for general adult medical examination without abnormal findings: Secondary | ICD-10-CM

## 2018-03-23 DIAGNOSIS — Z125 Encounter for screening for malignant neoplasm of prostate: Secondary | ICD-10-CM

## 2018-03-23 DIAGNOSIS — R748 Abnormal levels of other serum enzymes: Secondary | ICD-10-CM

## 2018-03-23 NOTE — Progress Notes (Signed)
Office: [phone]  /  Fax: [phone]   HPI:   Chief Complaint: OBESITY Darrell Mckinney is here to discuss his progress with his obesity treatment plan. He is on the lower carbohydrate, vegetable and lean protein rich diet plan while traveling or follow the Category 3 plan and is following his eating plan approximately 50 % of the time. He states he is doing cardio and weights for 90 minutes 4 times per week. Darrell Mckinney was on a cruise and had some celebration eating. He tried to be mindful about his eating, worked on decreasing simple carbohydrates but had increase temptations. He still exercised on the cruise. He is now back on track with his eating plan.  His weight is 242 lb (109.8 kg) today and has gained 1 pound since his last visit. He has lost 9 lbs since starting treatment with [name].  Vitamin D Deficiency Darrell Mckinney has a diagnosis of vitamin D deficiency. He is stable on prescription Vit D, not yet at goal. He denies nausea, vomiting or muscle weakness.  At risk for osteopenia and osteoporosis Darrell Mckinney is at higher risk of osteopenia and osteoporosis due to vitamin D deficiency.   ALLERGIES: No Known Allergies  MEDICATIONS: Current Outpatient Medications on File Prior to Visit  Medication Sig Dispense Refill  . B Complex Vitamins (VITAMIN B COMPLEX PO) Take 1 tablet by mouth daily.    Marland Kitchen ibuprofen (ADVIL,MOTRIN) 800 MG tablet Take 800 mg by mouth every 6 (six) hours as needed.     Marland Kitchen levocetirizine (XYZAL) 5 MG tablet Take 5 mg by mouth every evening.     . montelukast (SINGULAIR) 10 MG tablet Take 10 mg by mouth daily.     . pantoprazole (PROTONIX) 40 MG tablet Take 40 mg by mouth daily.     No current facility-administered medications on file prior to visit.     PAST MEDICAL HISTORY: Past Medical History:  Diagnosis Date  . Acid reflux   . Back pain   . Bulging lumbar disc   . Fatigue   . GERD (gastroesophageal reflux disease)   . Hay fever   . Joint pain   . Muscle pain     . Sciatic pain   . Seasonal allergies     PAST SURGICAL HISTORY: Past Surgical History:  Procedure Laterality Date  . BACK SURGERY  ~2006  . COLONOSCOPY  12/2008   RMR: normal  . COLONOSCOPY N/A 10/26/2015   Procedure: COLONOSCOPY;  Surgeon: Daneil Dolin, MD;  [facility];  Service: Endoscopy;  Laterality: N/A;  11:15 - moved to 12:00 - pt knows to arrive at 11:00  . TURBINATE RESECTION Bilateral 01/25/2013   Procedure: BILATERAL TURBINATE RESECTION;  Surgeon: Ascencion Dike, MD;  Location: [facility];  Service: ENT;  Laterality: Bilateral;    SOCIAL HISTORY: Social History   Tobacco Use  . Smoking status: Never Smoker  . Smokeless tobacco: Never Used  Substance Use Topics  . Alcohol use: Yes    Alcohol/week: 0.0 oz    Comment: six pack per week  . Drug use: No    FAMILY HISTORY: Family History  Problem Relation Age of Onset  . Colon cancer Father        dx age 56, deceased age 60  . Colon cancer Paternal Uncle        age 3s  . Stroke Mother     ROS: Review of Systems  Constitutional: Negative for weight loss.  Gastrointestinal: Negative for nausea  and vomiting.  Musculoskeletal:       Negative muscle weakness    PHYSICAL EXAM: Blood pressure 123/76, pulse (!) 57, temperature 98.4 F (36.9 C), temperature source Oral, height 6\' 1"  (1.854 m), weight 242 lb (109.8 kg), SpO2 97 %. Body mass index is 31.93 kg/m. Physical Exam  Constitutional: He is oriented to person, place, and time. He appears well-developed and well-nourished.  Cardiovascular: Normal rate.  Pulmonary/Chest: Effort normal.  Musculoskeletal: Normal range of motion.  Neurological: He is oriented to person, place, and time.  Skin: Skin is warm and dry.  Psychiatric: He has a normal mood and affect. His behavior is normal.  Vitals reviewed.   RECENT LABS AND TESTS: BMET    Component Value Date/Time   NA 141 02/08/2018 0943   K 4.3 02/08/2018 0943   CL 99  02/08/2018 0943   CO2 24 02/08/2018 0943   GLUCOSE 103 (H) 02/08/2018 0943   BUN 15 02/08/2018 0943   CREATININE 1.14 02/08/2018 0943   CALCIUM 9.3 02/08/2018 0943   GFRNONAA 75 02/08/2018 0943   GFRAA 86 02/08/2018 0943   Lab Results  Component Value Date   HGBA1C 5.1 02/08/2018   Lab Results  Component Value Date   INSULIN 18.8 02/08/2018   CBC    Component Value Date/Time   WBC 5.5 02/08/2018 0943   RBC 4.71 02/08/2018 0943   HGB 15.6 02/08/2018 0943   HCT 46.3 02/08/2018 0943   MCV 98 (H) 02/08/2018 0943   MCH 33.1 (H) 02/08/2018 0943   MCHC 33.7 02/08/2018 0943   RDW 12.7 02/08/2018 0943   LYMPHSABS 1.8 02/08/2018 0943   EOSABS 0.1 02/08/2018 0943   BASOSABS 0.0 02/08/2018 0943   Iron/TIBC/Ferritin/ %Sat No results found for: IRON, TIBC, FERRITIN, IRONPCTSAT Lipid Panel     Component Value Date/Time   CHOL 137 02/08/2018 0943   TRIG 215 (H) 02/08/2018 0943   HDL 35 (L) 02/08/2018 0943   LDLCALC 59 02/08/2018 0943   Hepatic Function Panel     Component Value Date/Time   PROT 6.7 02/08/2018 0943   ALBUMIN 4.5 02/08/2018 0943   AST 35 02/08/2018 0943   ALT 54 (H) 02/08/2018 0943   ALKPHOS 52 02/08/2018 0943   BILITOT 0.3 02/08/2018 0943      Component Value Date/Time   TSH 1.740 02/08/2018 0943  Results for Darrell Mckinney, Darrell Mckinney (MRN [id number]) as of 03/23/2018 15:42  Ref. Range 02/08/2018 09:43  Vitamin D, 25-Hydroxy Latest Ref Range: 30.0 - 100.0 ng/mL 23.3 (L)    ASSESSMENT AND PLAN: Vitamin D deficiency - Plan: Vitamin D, Ergocalciferol, (DRISDOL) 50000 units CAPS capsule  At risk for osteoporosis  Class 1 obesity without serious comorbidity with body mass index (BMI) of 32.0 to 32.9 in adult, unspecified obesity type  PLAN:  Vitamin D Deficiency Darrell Mckinney was informed that low vitamin D levels contributes to fatigue and are associated with obesity, breast, and colon cancer. Darrell Mckinney agrees to continue taking prescription Vit D @50 ,000 IU every week  #4 and we will refill for 1 month. He will follow up for routine testing of vitamin D, at least 2-3 times per year. He was informed of the risk of over-replacement of vitamin D and agrees to not increase his dose unless he discusses this with [name] first. Darrell Mckinney agrees to follow up with our clinic in 2 to 3 weeks.  At risk for osteopenia and osteoporosis Darrell Mckinney is at risk for osteopenia and osteoporsis due to his vitamin D deficiency.  He was encouraged to take his vitamin D and follow his higher calcium diet and increase strengthening exercise to help strengthen his bones and decrease his risk of osteopenia and osteoporosis.  Obesity Darrell Mckinney is currently in the action stage of change. As such, his goal is to continue with weight loss efforts He has agreed to follow the Category 3 plan Darrell Mckinney has been instructed to work up to a goal of 150 minutes of combined cardio and strengthening exercise per week for weight loss and overall health benefits. We discussed the following Behavioral Modification Strategies today: decrease eating out, work on meal planning and easy cooking plans, and celebration eating strategies   Darrell Mckinney has agreed to follow up with our clinic in 2 to 3 weeks. He was informed of the importance of frequent follow up visits to maximize his success with intensive lifestyle modifications for his multiple health conditions.   OBESITY BEHAVIORAL INTERVENTION VISIT  Today's visit was # 3 out of 22.  Starting weight: 251 lbs Starting date: 02/08/18 Today's weight : 242 lbs  Today's date: 03/22/2018 Total lbs lost to date: 9 (Patients must lose 7 lbs in the first 6 months to continue with counseling)   ASK: We discussed the diagnosis of obesity with Darrell Mckinney today and Darrell Mckinney agreed to give [name] permission to discuss obesity behavioral modification therapy today.  ASSESS: Darrell Mckinney has the diagnosis of obesity and his BMI today is 31.93 Darrell Mckinney is in the action stage of  change   ADVISE: Darrell Mckinney was educated on the multiple health risks of obesity as well as the benefit of weight loss to improve his health. He was advised of the need for long term treatment and the importance of lifestyle modifications.  AGREE: Multiple dietary modification options and treatment options were discussed and  Darrell Mckinney agreed to the above obesity treatment plan.  I, Trixie Dredge, am acting as transcriptionist for Dennard Nip, MD  I have reviewed the above documentation for accuracy and completeness, and I agree with the above. -Dennard Nip, MD

## 2018-03-23 NOTE — Progress Notes (Signed)
   Subjective:    Patient ID: Darrell Mckinney, male    DOB: 06-Oct-1966, 52 y.o.   MRN: [id number]  HPI  The patient comes in today for a wellness visit.  Patient relates he is trying to lose weight He is watching diet He is exercising regular basis Denies any rectal bleeding hematuria Denies any fever sweats chills. Does not smoke.  Does not use alcohol excessively Denies any sleep apnea. A review of their health history was completed.  A review of medications was also completed.  Any needed refills; No Denies being depressed Eating habits: Good  Falls/  MVA accidents in past few months:No  Regular exercise: Yes  Specialist pt sees on regular basis: Allergist  Preventative health issues were discussed.   Additional concerns: Yes, he will discuss with you.   Review of Systems  Constitutional: Negative for activity change, appetite change and fever.  HENT: Negative for congestion and rhinorrhea.   Eyes: Negative for discharge.  Respiratory: Negative for cough and wheezing.   Cardiovascular: Negative for chest pain.  Gastrointestinal: Negative for abdominal pain, blood in stool and vomiting.  Genitourinary: Negative for difficulty urinating and frequency.  Musculoskeletal: Negative for neck pain.  Skin: Negative for rash.  Allergic/Immunologic: Negative for environmental allergies and food allergies.  Neurological: Negative for weakness and headaches.  Psychiatric/Behavioral: Negative for agitation.       Objective:   Physical Exam  Constitutional: He appears well-developed and well-nourished.  HENT:  Head: Normocephalic and atraumatic.  Right Ear: External ear normal.  Left Ear: External ear normal.  Nose: Nose normal.  Mouth/Throat: Oropharynx is clear and moist.  Eyes: Pupils are equal, round, and reactive to light. EOM are normal.  Neck: Normal range of motion. Neck supple. No thyromegaly present.  Cardiovascular: Normal rate, regular rhythm and normal heart  sounds.  No murmur heard. Pulmonary/Chest: Effort normal and breath sounds normal. No respiratory distress. He has no wheezes.  Abdominal: Soft. Bowel sounds are normal. He exhibits no distension and no mass. There is no tenderness.  Genitourinary: Penis normal.  Musculoskeletal: Normal range of motion. He exhibits no edema.  Lymphadenopathy:    He has no cervical adenopathy.  Neurological: He is alert. He exhibits normal muscle tone.  Skin: Skin is warm and dry. No erythema.  Psychiatric: He has a normal mood and affect. His behavior is normal. Judgment normal.     Patient had lab work done through wellness center for his weight and it detected that a liver enzymes slightly elevated more than likely fatty liver but I do recommend repeating this again after he has lost weight may need further work-up depending on the results     Assessment & Plan:  .Adult wellness-complete.wellness physical was conducted today. Importance of diet and exercise were discussed in detail.  In addition to this a discussion regarding safety was also covered. We also reviewed over immunizations and gave recommendations regarding current immunization needed for age.  In addition to this additional areas were also touched on including: Preventative health exams needed: Colonoscopy patient will be due colonoscopy 2021 Patient will get PSA and repeat liver enzymes later this year Patient will follow-up in the fall to discuss some upcoming exams Patient was advised yearly wellness exam

## 2018-03-29 DIAGNOSIS — J3089 Other allergic rhinitis: Secondary | ICD-10-CM | POA: Diagnosis not present

## 2018-03-29 DIAGNOSIS — J3081 Allergic rhinitis due to animal (cat) (dog) hair and dander: Secondary | ICD-10-CM | POA: Diagnosis not present

## 2018-03-29 DIAGNOSIS — J301 Allergic rhinitis due to pollen: Secondary | ICD-10-CM | POA: Diagnosis not present

## 2018-04-05 DIAGNOSIS — J3089 Other allergic rhinitis: Secondary | ICD-10-CM | POA: Diagnosis not present

## 2018-04-05 DIAGNOSIS — J301 Allergic rhinitis due to pollen: Secondary | ICD-10-CM | POA: Diagnosis not present

## 2018-04-05 DIAGNOSIS — J3081 Allergic rhinitis due to animal (cat) (dog) hair and dander: Secondary | ICD-10-CM | POA: Diagnosis not present

## 2018-04-12 ENCOUNTER — Encounter (INDEPENDENT_AMBULATORY_CARE_PROVIDER_SITE_OTHER): Payer: Self-pay

## 2018-04-12 ENCOUNTER — Ambulatory Visit (INDEPENDENT_AMBULATORY_CARE_PROVIDER_SITE_OTHER): Payer: 59 | Admitting: Family Medicine

## 2018-04-12 DIAGNOSIS — J3089 Other allergic rhinitis: Secondary | ICD-10-CM | POA: Diagnosis not present

## 2018-04-12 DIAGNOSIS — J301 Allergic rhinitis due to pollen: Secondary | ICD-10-CM | POA: Diagnosis not present

## 2018-04-12 DIAGNOSIS — J3081 Allergic rhinitis due to animal (cat) (dog) hair and dander: Secondary | ICD-10-CM | POA: Diagnosis not present

## 2018-04-20 ENCOUNTER — Telehealth (INDEPENDENT_AMBULATORY_CARE_PROVIDER_SITE_OTHER): Payer: Self-pay | Admitting: Family Medicine

## 2018-04-20 ENCOUNTER — Other Ambulatory Visit (INDEPENDENT_AMBULATORY_CARE_PROVIDER_SITE_OTHER): Payer: Self-pay | Admitting: Family Medicine

## 2018-04-20 ENCOUNTER — Encounter (INDEPENDENT_AMBULATORY_CARE_PROVIDER_SITE_OTHER): Payer: Self-pay

## 2018-04-20 DIAGNOSIS — J3089 Other allergic rhinitis: Secondary | ICD-10-CM | POA: Diagnosis not present

## 2018-04-20 DIAGNOSIS — E559 Vitamin D deficiency, unspecified: Secondary | ICD-10-CM

## 2018-04-20 DIAGNOSIS — J3081 Allergic rhinitis due to animal (cat) (dog) hair and dander: Secondary | ICD-10-CM | POA: Diagnosis not present

## 2018-04-20 DIAGNOSIS — J301 Allergic rhinitis due to pollen: Secondary | ICD-10-CM | POA: Diagnosis not present

## 2018-04-20 NOTE — Telephone Encounter (Signed)
Patient called requesting a refill on his vitamin D.  [facility] [address]  [phone]

## 2018-04-20 NOTE — Telephone Encounter (Signed)
Sent the patient a my chart message. Darrell Mckinney, [place]

## 2018-04-23 ENCOUNTER — Encounter (INDEPENDENT_AMBULATORY_CARE_PROVIDER_SITE_OTHER): Payer: Self-pay | Admitting: Family Medicine

## 2018-04-26 DIAGNOSIS — J3081 Allergic rhinitis due to animal (cat) (dog) hair and dander: Secondary | ICD-10-CM | POA: Diagnosis not present

## 2018-04-26 DIAGNOSIS — J3089 Other allergic rhinitis: Secondary | ICD-10-CM | POA: Diagnosis not present

## 2018-04-26 DIAGNOSIS — J301 Allergic rhinitis due to pollen: Secondary | ICD-10-CM | POA: Diagnosis not present

## 2018-05-03 DIAGNOSIS — J3089 Other allergic rhinitis: Secondary | ICD-10-CM | POA: Diagnosis not present

## 2018-05-03 DIAGNOSIS — J301 Allergic rhinitis due to pollen: Secondary | ICD-10-CM | POA: Diagnosis not present

## 2018-05-03 DIAGNOSIS — J3081 Allergic rhinitis due to animal (cat) (dog) hair and dander: Secondary | ICD-10-CM | POA: Diagnosis not present

## 2018-05-04 ENCOUNTER — Ambulatory Visit (INDEPENDENT_AMBULATORY_CARE_PROVIDER_SITE_OTHER): Payer: 59 | Admitting: Family Medicine

## 2018-05-04 VITALS — BP 122/72 | HR 59 | Temp 98.0°F | Ht 73.0 in | Wt 241.0 lb

## 2018-05-04 DIAGNOSIS — E669 Obesity, unspecified: Secondary | ICD-10-CM | POA: Diagnosis not present

## 2018-05-04 DIAGNOSIS — Z6831 Body mass index (BMI) 31.0-31.9, adult: Secondary | ICD-10-CM

## 2018-05-04 DIAGNOSIS — E559 Vitamin D deficiency, unspecified: Secondary | ICD-10-CM

## 2018-05-04 MED ORDER — VITAMIN D (ERGOCALCIFEROL) 1.25 MG (50000 UNIT) PO CAPS: 50000.0000 [IU] | ORAL_CAPSULE | ORAL | 0 refills | Status: DC

## 2018-05-04 NOTE — Progress Notes (Signed)
Office: [phone]  /  Fax: [phone]   HPI:   Chief Complaint: OBESITY Darrell Mckinney is here to discuss his progress with his obesity treatment plan. He is on the Category 3 plan and is following his eating plan approximately 80 to 90 % of the time. He states he is doing cardio and weights for 120 minutes 4 times per week. Darrell Mckinney continues to go in the right direction with weight loss, but he has extra challenges with frequent [place] travel and having to eat out. He notes increased stress with his son's lack of motivation for his future. His weight is 241 lb (109.3 kg) today and has had a weight loss of 1 pound over a period of 6 weeks since his last visit. He has lost 10 lbs since starting treatment with Darrell Mckinney.  Vitamin D deficiency Darrell Mckinney has a diagnosis of vitamin D deficiency. Darrell Mckinney is stable on vitamin D and has been out for one month. He is not yet at goal. Darrell Mckinney denies nausea, vomiting or muscle weakness.  ALLERGIES: No Known Allergies  MEDICATIONS: Current Outpatient Medications on File Prior to Visit  Medication Sig Dispense Refill  . B Complex Vitamins (VITAMIN B COMPLEX PO) Take 1 tablet by mouth daily.    Marland Kitchen ibuprofen (ADVIL,MOTRIN) 800 MG tablet Take 800 mg by mouth every 6 (six) hours as needed.     Marland Kitchen levocetirizine (XYZAL) 5 MG tablet Take 5 mg by mouth every evening.     . montelukast (SINGULAIR) 10 MG tablet Take 10 mg by mouth daily.     . pantoprazole (PROTONIX) 40 MG tablet Take 40 mg by mouth daily.    . Vitamin D, Ergocalciferol, (DRISDOL) 50000 units CAPS capsule Take 1 capsule (50,000 Units total) by mouth every 7 (seven) days. 4 capsule 0   No current facility-administered medications on file prior to visit.     PAST MEDICAL HISTORY: Past Medical History:  Diagnosis Date  . Acid reflux   . Back pain   . Bulging lumbar disc   . Fatigue   . GERD (gastroesophageal reflux disease)   . Hay fever   . Joint pain   . Muscle pain   . Sciatic pain   .  Seasonal allergies     PAST SURGICAL HISTORY: Past Surgical History:  Procedure Laterality Date  . BACK SURGERY  ~2006  . COLONOSCOPY  12/2008   RMR: normal  . COLONOSCOPY N/A 10/26/2015   Procedure: COLONOSCOPY;  Surgeon: Daneil Dolin, MD;  [facility];  Service: Endoscopy;  Laterality: N/A;  11:15 - moved to 12:00 - pt knows to arrive at 11:00  . TURBINATE RESECTION Bilateral 01/25/2013   Procedure: BILATERAL TURBINATE RESECTION;  Surgeon: Ascencion Dike, MD;  Location: [facility];  Service: ENT;  Laterality: Bilateral;    SOCIAL HISTORY: Social History   Tobacco Use  . Smoking status: Never Smoker  . Smokeless tobacco: Never Used  Substance Use Topics  . Alcohol use: Yes    Alcohol/week: 0.0 oz    Comment: six pack per week  . Drug use: No    FAMILY HISTORY: Family History  Problem Relation Age of Onset  . Colon cancer Father        dx age 15, deceased age 18  . Colon cancer Paternal Uncle        age 94s  . Stroke Mother     ROS: Review of Systems  Constitutional: Positive for weight loss.  Gastrointestinal: Negative  for nausea and vomiting.  Musculoskeletal:       Negative for muscle weakness    PHYSICAL EXAM: Blood pressure 122/72, pulse (!) 59, temperature 98 F (36.7 C), temperature source Oral, height 6\' 1"  (1.854 m), weight 241 lb (109.3 kg), SpO2 98 %. Body mass index is 31.8 kg/m. Physical Exam  Constitutional: He is oriented to person, place, and time. He appears well-developed and well-nourished.  Cardiovascular: Normal rate.  Pulmonary/Chest: Effort normal.  Musculoskeletal: Normal range of motion.  Neurological: He is oriented to person, place, and time.  Skin: Skin is warm and dry.  Psychiatric: He has a normal mood and affect. His behavior is normal.  Vitals reviewed.   RECENT LABS AND TESTS: BMET    Component Value Date/Time   NA 141 02/08/2018 0943   K 4.3 02/08/2018 0943   CL 99 02/08/2018 0943   CO2 24  02/08/2018 0943   GLUCOSE 103 (H) 02/08/2018 0943   BUN 15 02/08/2018 0943   CREATININE 1.14 02/08/2018 0943   CALCIUM 9.3 02/08/2018 0943   GFRNONAA 75 02/08/2018 0943   GFRAA 86 02/08/2018 0943   Lab Results  Component Value Date   HGBA1C 5.1 02/08/2018   Lab Results  Component Value Date   INSULIN 18.8 02/08/2018   CBC    Component Value Date/Time   WBC 5.5 02/08/2018 0943   RBC 4.71 02/08/2018 0943   HGB 15.6 02/08/2018 0943   HCT 46.3 02/08/2018 0943   MCV 98 (H) 02/08/2018 0943   MCH 33.1 (H) 02/08/2018 0943   MCHC 33.7 02/08/2018 0943   RDW 12.7 02/08/2018 0943   LYMPHSABS 1.8 02/08/2018 0943   EOSABS 0.1 02/08/2018 0943   BASOSABS 0.0 02/08/2018 0943   Iron/TIBC/Ferritin/ %Sat No results found for: IRON, TIBC, FERRITIN, IRONPCTSAT Lipid Panel     Component Value Date/Time   CHOL 137 02/08/2018 0943   TRIG 215 (H) 02/08/2018 0943   HDL 35 (L) 02/08/2018 0943   LDLCALC 59 02/08/2018 0943   Hepatic Function Panel     Component Value Date/Time   PROT 6.7 02/08/2018 0943   ALBUMIN 4.5 02/08/2018 0943   AST 35 02/08/2018 0943   ALT 54 (H) 02/08/2018 0943   ALKPHOS 52 02/08/2018 0943   BILITOT 0.3 02/08/2018 0943      Component Value Date/Time   TSH 1.740 02/08/2018 0943   Results for NIKLAS, CHRETIEN (MRN [id number]) as of 05/04/2018 12:07  Ref. Range 02/08/2018 09:43  Vitamin D, 25-Hydroxy Latest Ref Range: 30.0 - 100.0 ng/mL 23.3 (L)   ASSESSMENT AND PLAN: Vitamin D deficiency - Plan: Vitamin D, Ergocalciferol, (DRISDOL) 50000 units CAPS capsule  Class 1 obesity with serious comorbidity and body mass index (BMI) of 31.0 to 31.9 in adult, unspecified obesity type  PLAN:  Vitamin D Deficiency Darrell Mckinney was informed that low vitamin D levels contributes to fatigue and are associated with obesity, breast, and colon cancer. He agrees to continue to take prescription Vit D @50 ,000 IU every week #4 with no refills and we will recheck labs in 1 month. Darrell Mckinney  will follow up for routine testing of vitamin D, at least 2-3 times per year. He was informed of the risk of over-replacement of vitamin D and agrees to not increase his dose unless he discusses this with Darrell Mckinney first. Darrell Mckinney agrees to follow up with our clinic in 4 weeks fasting.  We spent > than 50% of the 30 minute visit on the counseling as documented in the note.  Obesity Darrell Mckinney is currently in the action stage of change. As such, his goal is to continue with weight loss efforts He has agreed to follow a lower carbohydrate, vegetable and lean protein rich diet plan Darrell Mckinney has been instructed to work up to a goal of 150 minutes of combined cardio and strengthening exercise per week for weight loss and overall health benefits. We discussed the following Behavioral Modification Strategies today: increasing lean protein intake, decreasing simple carbohydrates  and work on meal planning and easy cooking plans  Darrell Mckinney has agreed to follow up with our clinic in 4 weeks fasting. He was informed of the importance of frequent follow up visits to maximize his success with intensive lifestyle modifications for his multiple health conditions.   OBESITY BEHAVIORAL INTERVENTION VISIT  Today's visit was # 4 out of 22.  Starting weight: 251 lbs Starting date: 02/08/18 Today's weight : 241 lbs Today's date: 05/04/2018 Total lbs lost to date: 10 (Patients must lose 7 lbs in the first 6 months to continue with counseling)   ASK: We discussed the diagnosis of obesity with Darrell Mckinney today and Darrell Mckinney agreed to give Darrell Mckinney permission to discuss obesity behavioral modification therapy today.  ASSESS: Darrell Mckinney has the diagnosis of obesity and his BMI today is 31.8 Darrell Mckinney is in the action stage of change   ADVISE: Darrell Mckinney was educated on the multiple health risks of obesity as well as the benefit of weight loss to improve his health. He was advised of the need for long term treatment and the importance  of lifestyle modifications.  AGREE: Multiple dietary modification options and treatment options were discussed and  Itai agreed to the above obesity treatment plan.  I, Doreene Nest, am acting as transcriptionist for Darrell Nip, MD  I have reviewed the above documentation for accuracy and completeness, and I agree with the above. -Darrell Nip, MD

## 2018-05-12 DIAGNOSIS — J3081 Allergic rhinitis due to animal (cat) (dog) hair and dander: Secondary | ICD-10-CM | POA: Diagnosis not present

## 2018-05-12 DIAGNOSIS — J301 Allergic rhinitis due to pollen: Secondary | ICD-10-CM | POA: Diagnosis not present

## 2018-05-12 DIAGNOSIS — J3089 Other allergic rhinitis: Secondary | ICD-10-CM | POA: Diagnosis not present

## 2018-05-17 DIAGNOSIS — J301 Allergic rhinitis due to pollen: Secondary | ICD-10-CM | POA: Diagnosis not present

## 2018-05-17 DIAGNOSIS — J3089 Other allergic rhinitis: Secondary | ICD-10-CM | POA: Diagnosis not present

## 2018-05-17 DIAGNOSIS — J3081 Allergic rhinitis due to animal (cat) (dog) hair and dander: Secondary | ICD-10-CM | POA: Diagnosis not present

## 2018-05-20 ENCOUNTER — Telehealth (INDEPENDENT_AMBULATORY_CARE_PROVIDER_SITE_OTHER): Payer: Self-pay | Admitting: Family Medicine

## 2018-05-20 NOTE — Telephone Encounter (Signed)
Patient requesting refill of his Vit D.  Please send to [facility], [place]. Thank you. Maudie Mercury

## 2018-05-27 ENCOUNTER — Other Ambulatory Visit (INDEPENDENT_AMBULATORY_CARE_PROVIDER_SITE_OTHER): Payer: Self-pay | Admitting: Family Medicine

## 2018-05-27 DIAGNOSIS — E559 Vitamin D deficiency, unspecified: Secondary | ICD-10-CM

## 2018-05-31 DIAGNOSIS — J3089 Other allergic rhinitis: Secondary | ICD-10-CM | POA: Diagnosis not present

## 2018-05-31 DIAGNOSIS — J3081 Allergic rhinitis due to animal (cat) (dog) hair and dander: Secondary | ICD-10-CM | POA: Diagnosis not present

## 2018-05-31 DIAGNOSIS — J301 Allergic rhinitis due to pollen: Secondary | ICD-10-CM | POA: Diagnosis not present

## 2018-06-02 ENCOUNTER — Ambulatory Visit (INDEPENDENT_AMBULATORY_CARE_PROVIDER_SITE_OTHER): Payer: 59 | Admitting: Family Medicine

## 2018-06-02 VITALS — BP 104/64 | HR 52 | Temp 97.7°F | Ht 73.0 in | Wt 241.0 lb

## 2018-06-02 DIAGNOSIS — R7303 Prediabetes: Secondary | ICD-10-CM

## 2018-06-02 DIAGNOSIS — E785 Hyperlipidemia, unspecified: Secondary | ICD-10-CM | POA: Diagnosis not present

## 2018-06-02 DIAGNOSIS — E559 Vitamin D deficiency, unspecified: Secondary | ICD-10-CM

## 2018-06-02 DIAGNOSIS — Z9189 Other specified personal risk factors, not elsewhere classified: Secondary | ICD-10-CM

## 2018-06-02 DIAGNOSIS — E669 Obesity, unspecified: Secondary | ICD-10-CM

## 2018-06-02 DIAGNOSIS — Z6831 Body mass index (BMI) 31.0-31.9, adult: Secondary | ICD-10-CM

## 2018-06-02 MED ORDER — VITAMIN D (ERGOCALCIFEROL) 1.25 MG (50000 UNIT) PO CAPS: 50000.0000 [IU] | ORAL_CAPSULE | ORAL | 0 refills | Status: DC

## 2018-06-02 NOTE — Progress Notes (Signed)
Office: [phone]  /  Fax: [phone]   HPI:   Chief Complaint: OBESITY Darrell Mckinney is here to discuss his progress with his obesity treatment plan. He is on the lower carbohydrate, vegetable and lean protein rich diet plan and is following his eating plan approximately 90-95 % of the time. He states he is doing weights and cardio for 90 minutes 4-5 times per week. Darrell Mckinney is frustrated he isn't losing weight faster. He is not counting carbohydrates or following our low carbohydrate plan closely, mostly trying to decrease bread and pasta. His fat % is decreasing but not as fast as he would like.  His weight is 241 lb (109.3 kg) today and has not lost weight since his last visit. He has lost 10 lbs since starting treatment with [name].  Vitamin D Deficiency Darrell Mckinney has a diagnosis of vitamin D deficiency. He is stable on prescription Vit D and denies nausea, vomiting or muscle weakness. He is due for labs.  Pre-Diabetes Darrell Mckinney has a diagnosis of pre-diabetes based on his elevated Hgb A1c and was informed this puts him at greater risk of developing diabetes. He is attempting to diet control, he is not on metformin and still notes polyphagia. He continues to work on diet and exercise to decrease risk of diabetes. He denies nausea or hypoglycemia.  At risk for diabetes Darrell Mckinney is at higher than average risk for developing diabetes due to his obesity and pre-diabetes. He currently denies polyuria or polydipsia.  Hyperlipidemia Darrell Mckinney has hyperlipidemia and attempting to improve his cholesterol levels with intensive lifestyle modification including a low saturated fat diet, exercise and weight loss. He denies any chest pain, claudication or myalgias.  ALLERGIES: No Known Allergies  MEDICATIONS: Current Outpatient Medications on File Prior to Visit  Medication Sig Dispense Refill  . B Complex Vitamins (VITAMIN B COMPLEX PO) Take 1 tablet by mouth daily.    Marland Kitchen ibuprofen (ADVIL,MOTRIN) 800 MG  tablet Take 800 mg by mouth every 6 (six) hours as needed.     Marland Kitchen levocetirizine (XYZAL) 5 MG tablet Take 5 mg by mouth every evening.     . montelukast (SINGULAIR) 10 MG tablet Take 10 mg by mouth daily.     . pantoprazole (PROTONIX) 40 MG tablet Take 40 mg by mouth daily.     No current facility-administered medications on file prior to visit.     PAST MEDICAL HISTORY: Past Medical History:  Diagnosis Date  . Acid reflux   . Back pain   . Bulging lumbar disc   . Fatigue   . GERD (gastroesophageal reflux disease)   . Hay fever   . Joint pain   . Muscle pain   . Sciatic pain   . Seasonal allergies     PAST SURGICAL HISTORY: Past Surgical History:  Procedure Laterality Date  . BACK SURGERY  ~2006  . COLONOSCOPY  12/2008   RMR: normal  . COLONOSCOPY N/A 10/26/2015   Procedure: COLONOSCOPY;  Surgeon: Daneil Dolin, MD;  [facility];  Service: Endoscopy;  Laterality: N/A;  11:15 - moved to 12:00 - pt knows to arrive at 11:00  . TURBINATE RESECTION Bilateral 01/25/2013   Procedure: BILATERAL TURBINATE RESECTION;  Surgeon: Ascencion Dike, MD;  Location: [facility];  Service: ENT;  Laterality: Bilateral;    SOCIAL HISTORY: Social History   Tobacco Use  . Smoking status: Never Smoker  . Smokeless tobacco: Never Used  Substance Use Topics  . Alcohol use: Yes  Alcohol/week: 0.0 oz    Comment: six pack per week  . Drug use: No    FAMILY HISTORY: Family History  Problem Relation Age of Onset  . Colon cancer Father        dx age 19, deceased age 43  . Colon cancer Paternal Uncle        age 32s  . Stroke Mother     ROS: Review of Systems  Constitutional: Negative for weight loss.  Cardiovascular: Negative for chest pain and claudication.  Gastrointestinal: Negative for nausea and vomiting.  Genitourinary: Negative for frequency.  Musculoskeletal: Negative for myalgias.       Negative muscle weakness  Endo/Heme/Allergies: Negative for  polydipsia.       Positive polyphagia Negative hypoglycemia    PHYSICAL EXAM: Blood pressure 104/64, pulse (!) 52, temperature 97.7 F (36.5 C), temperature source Oral, height 6\' 1"  (1.854 m), weight 241 lb (109.3 kg), SpO2 97 %. Body mass index is 31.8 kg/m. Physical Exam  Constitutional: He is oriented to person, place, and time. He appears well-developed and well-nourished.  Cardiovascular: Normal rate.  Pulmonary/Chest: Effort normal.  Musculoskeletal: Normal range of motion.  Neurological: He is oriented to person, place, and time.  Skin: Skin is warm and dry.  Psychiatric: He has a normal mood and affect. His behavior is normal.  Vitals reviewed.   RECENT LABS AND TESTS: BMET    Component Value Date/Time   NA 141 02/08/2018 0943   K 4.3 02/08/2018 0943   CL 99 02/08/2018 0943   CO2 24 02/08/2018 0943   GLUCOSE 103 (H) 02/08/2018 0943   BUN 15 02/08/2018 0943   CREATININE 1.14 02/08/2018 0943   CALCIUM 9.3 02/08/2018 0943   GFRNONAA 75 02/08/2018 0943   GFRAA 86 02/08/2018 0943   Lab Results  Component Value Date   HGBA1C 5.1 02/08/2018   Lab Results  Component Value Date   INSULIN 18.8 02/08/2018   CBC    Component Value Date/Time   WBC 5.5 02/08/2018 0943   RBC 4.71 02/08/2018 0943   HGB 15.6 02/08/2018 0943   HCT 46.3 02/08/2018 0943   MCV 98 (H) 02/08/2018 0943   MCH 33.1 (H) 02/08/2018 0943   MCHC 33.7 02/08/2018 0943   RDW 12.7 02/08/2018 0943   LYMPHSABS 1.8 02/08/2018 0943   EOSABS 0.1 02/08/2018 0943   BASOSABS 0.0 02/08/2018 0943   Iron/TIBC/Ferritin/ %Sat No results found for: IRON, TIBC, FERRITIN, IRONPCTSAT Lipid Panel     Component Value Date/Time   CHOL 137 02/08/2018 0943   TRIG 215 (H) 02/08/2018 0943   HDL 35 (L) 02/08/2018 0943   LDLCALC 59 02/08/2018 0943   Hepatic Function Panel     Component Value Date/Time   PROT 6.7 02/08/2018 0943   ALBUMIN 4.5 02/08/2018 0943   AST 35 02/08/2018 0943   ALT 54 (H) 02/08/2018  0943   ALKPHOS 52 02/08/2018 0943   BILITOT 0.3 02/08/2018 0943      Component Value Date/Time   TSH 1.740 02/08/2018 0943  Results for Darrell Mckinney (MRN [id number]) as of 06/02/2018 07:56  Ref. Range 02/08/2018 09:43  Vitamin D, 25-Hydroxy Latest Ref Range: 30.0 - 100.0 ng/mL 23.3 (L)    ASSESSMENT AND PLAN: Vitamin D deficiency - Plan: Vitamin D, Ergocalciferol, (DRISDOL) 50000 units CAPS capsule  Pre-diabetes  Hyperlipidemia, unspecified hyperlipidemia type  At risk for diabetes mellitus  Class 1 obesity with serious comorbidity and body mass index (BMI) of 31.0 to 31.9 in adult,  unspecified obesity type  PLAN:  Vitamin D Deficiency Darrell Mckinney was informed that low vitamin D levels contributes to fatigue and are associated with obesity, breast, and colon cancer. Darrell Mckinney agrees to continue taking prescription Vit D @50 ,000 IU every week #4 and we will refill for 1 month. He will follow up for routine testing of vitamin D, at least 2-3 times per year. He was informed of the risk of over-replacement of vitamin D and agrees to not increase his dose unless he discusses this with [name] first. We will check labs and Darrell Mckinney agrees to follow up with our clinic in 3 to 4 weeks.  Pre-Diabetes Darrell Mckinney will continue to work on weight loss, exercise, and decreasing simple carbohydrates in his diet to help decrease the risk of diabetes. We dicussed metformin including benefits and risks. He was informed that eating too many simple carbohydrates or too many calories at one sitting increases the likelihood of GI side effects. D[place]ne declined metformin for now and a prescription was not written today. We will check labs and Darrell Mckinney agrees to follow up with our clinic in 3 to 4 weeks as directed to monitor his progress.  Diabetes risk counselling Darrell Mckinney was given extended (15 minutes) diabetes prevention counseling today. He is 52 y.o. male and has risk factors for diabetes including obesity and  pre-diabetes. We discussed intensive lifestyle modifications today with an emphasis on weight loss as well as increasing exercise and decreasing simple carbohydrates in his diet.  Hyperlipidemia Darrell Mckinney was informed of the American Heart Association Guidelines emphasizing intensive lifestyle modifications as the first line treatment for hyperlipidemia. We discussed many lifestyle modifications today in depth, and Darrell Mckinney will continue to work on decreasing saturated fats such as fatty red meat, butter and many fried foods. He will also increase vegetables and lean protein in his diet and continue to work on exercise and weight loss efforts. We will check labs and Darrell Mckinney agrees to follow up with our clinic in 3 to 4 weeks.  Obesity Darrell Mckinney is currently in the action stage of change. As such, his goal is to continue with weight loss efforts He has agreed to follow a lower carbohydrate, vegetable and lean protein rich diet plan Darrell Mckinney has been instructed to work up to a goal of 150 minutes of combined cardio and strengthening exercise per week for weight loss and overall health benefits. We discussed the following Behavioral Modification Strategies today: increasing lean protein intake, decreasing simple carbohydrates  and work on meal planning and easy cooking plans Darrell Mckinney was educated on low carbohydrate plan and the importance of getting healthy and not just getting to a number on the scale.  Darrell Mckinney has agreed to follow up with our clinic in 3 to 4 weeks. He was informed of the importance of frequent follow up visits to maximize his success with intensive lifestyle modifications for his multiple health conditions.   OBESITY BEHAVIORAL INTERVENTION VISIT  Today's visit was # 5 out of 22.  Starting weight: 251 lbs Starting date: 02/08/18 Today's weight : 241 lbs Today's date: 06/02/2018 Total lbs lost to date: 10 (Patients must lose 7 lbs in the first 6 months to continue with  counseling)   ASK: We discussed the diagnosis of obesity with [name] today and Legrand Como agreed to give [name] permission to discuss obesity behavioral modification therapy today.  ASSESS: Alfonzia has the diagnosis of obesity and his BMI today is 31.8 Lanson is in the action stage of change   ADVISE: Saketh  was educated on the multiple health risks of obesity as well as the benefit of weight loss to improve his health. He was advised of the need for long term treatment and the importance of lifestyle modifications.  AGREE: Multiple dietary modification options and treatment options were discussed and  Ricco agreed to the above obesity treatment plan.  I, Trixie Dredge, am acting as transcriptionist for Dennard Nip, MD  I have reviewed the above documentation for accuracy and completeness, and I agree with the above. -Dennard Nip, MD

## 2018-06-03 LAB — LIPID PANEL WITH LDL/HDL RATIO
Cholesterol, Total: 135 mg/dL (ref 100–199)
HDL: 34 mg/dL — ABNORMAL LOW (ref >39)
LDL Calculated: 63 mg/dL (ref 0–99)
LDl/HDL Ratio: 1.9 ratio (ref 0.0–3.6)
Triglycerides: 192 mg/dL — ABNORMAL HIGH (ref 0–149)
VLDL Cholesterol Cal: 38 mg/dL (ref 5–40)

## 2018-06-03 LAB — COMPREHENSIVE METABOLIC PANEL
ALT: 37 IU/L (ref 0–44)
AST: 26 IU/L (ref 0–40)
Albumin/Globulin Ratio: 1.9 (ref 1.2–2.2)
Albumin: 4.5 g/dL (ref 3.5–5.5)
Alkaline Phosphatase: 53 IU/L (ref 39–117)
BUN/Creatinine Ratio: 15 (ref 9–20)
BUN: 17 mg/dL (ref 6–24)
Bilirubin Total: <0.2 mg/dL (ref 0.0–1.2)
CO2: 20 mmol/L (ref 20–29)
Calcium: 9.4 mg/dL (ref 8.7–10.2)
Chloride: 103 mmol/L (ref 96–106)
Creatinine, Ser: 1.16 mg/dL (ref 0.76–1.27)
GFR calc Af Amer: 84 mL/min/{1.73_m2} (ref >59)
GFR calc non Af Amer: 73 mL/min/{1.73_m2} (ref >59)
Globulin, Total: 2.4 g/dL (ref 1.5–4.5)
Glucose: 99 mg/dL (ref 65–99)
Potassium: 4.2 mmol/L (ref 3.5–5.2)
Sodium: 142 mmol/L (ref 134–144)
Total Protein: 6.9 g/dL (ref 6.0–8.5)

## 2018-06-03 LAB — HEMOGLOBIN A1C
Est. average glucose Bld gHb Est-mCnc: 100 mg/dL
Hgb A1c MFr Bld: 5.1 % (ref 4.8–5.6)

## 2018-06-03 LAB — INSULIN, RANDOM: INSULIN: 24.1 u[IU]/mL (ref 2.6–24.9)

## 2018-06-03 LAB — VITAMIN D 25 HYDROXY (VIT D DEFICIENCY, FRACTURES): Vit D, 25-Hydroxy: 58.1 ng/mL (ref 30.0–100.0)

## 2018-06-07 DIAGNOSIS — J3089 Other allergic rhinitis: Secondary | ICD-10-CM | POA: Diagnosis not present

## 2018-06-07 DIAGNOSIS — J3081 Allergic rhinitis due to animal (cat) (dog) hair and dander: Secondary | ICD-10-CM | POA: Diagnosis not present

## 2018-06-07 DIAGNOSIS — J301 Allergic rhinitis due to pollen: Secondary | ICD-10-CM | POA: Diagnosis not present

## 2018-06-08 DIAGNOSIS — J3089 Other allergic rhinitis: Secondary | ICD-10-CM | POA: Diagnosis not present

## 2018-06-08 DIAGNOSIS — J3081 Allergic rhinitis due to animal (cat) (dog) hair and dander: Secondary | ICD-10-CM | POA: Diagnosis not present

## 2018-06-09 ENCOUNTER — Encounter: Payer: Self-pay | Admitting: Family Medicine

## 2018-06-09 ENCOUNTER — Ambulatory Visit: Payer: 59 | Admitting: Family Medicine

## 2018-06-09 VITALS — BP 130/82 | HR 72 | Ht 72.0 in | Wt 244.0 lb

## 2018-06-09 DIAGNOSIS — Z0289 Encounter for other administrative examinations: Secondary | ICD-10-CM

## 2018-06-09 DIAGNOSIS — Z029 Encounter for administrative examinations, unspecified: Secondary | ICD-10-CM

## 2018-06-09 NOTE — Progress Notes (Signed)
BP 130/82   Pulse 72   Ht 6' (1.829 m)   Wt 244 lb (110.7 kg)   BMI 33.09 kg/m    Subjective:    Patient ID: Darrell Mckinney, male    DOB: 1966/08/04, 52 y.o.   MRN: [id number]  HPI: Darrell Mckinney is a 52 y.o. male  Chief Complaint  Patient presents with  . 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 Interim medical history since our last visit reviewed. Allergies and medications reviewed and updated.  Review of Systems  Constitutional: Negative.   HENT: Negative.   Eyes: Negative.   Respiratory: Negative.   Cardiovascular: Negative.   Gastrointestinal: Negative.   Endocrine: Negative.   Genitourinary: Negative.   Musculoskeletal: Negative.   Skin: Negative.   Allergic/Immunologic: Negative.   Neurological: Negative.   Hematological: Negative.   Psychiatric/Behavioral: Negative.     Per HPI unless specifically indicated above     Objective:    BP 130/82   Pulse 72   Ht 6' (1.829 m)   Wt 244 lb (110.7 kg)   BMI 33.09 kg/m   Wt Readings from Last 3 Encounters:  06/09/18 244 lb (110.7 kg)  06/02/18 241 lb (109.3 kg)  05/04/18 241 lb (109.3 kg)    Physical Exam  Constitutional: He is oriented to person, place, and time. He appears well-developed and well-nourished.  HENT:  Head: Normocephalic.  Right Ear: External ear normal.  Left Ear: External ear normal.  Nose: Nose normal.  Eyes: Pupils are equal, round, and reactive to light. Conjunctivae and EOM are normal.  Neck: Normal range of motion. Neck supple. No thyromegaly present.  Cardiovascular: Normal rate, regular rhythm, normal heart sounds and intact distal pulses.  Pulmonary/Chest: Effort normal and breath sounds normal.  Abdominal: Soft. Bowel sounds are normal. There is no splenomegaly or hepatomegaly.  Genitourinary: Penis normal.  Musculoskeletal: Normal range of motion.  Lymphadenopathy:    He  has no cervical adenopathy.  Neurological: He is alert and oriented to person, place, and time. He has normal reflexes.  Skin: Skin is warm and dry.  Psychiatric: He has a normal mood and affect. His behavior is normal. Judgment and thought content normal.    Results for orders placed or performed in visit on 06/02/18  Comprehensive metabolic panel  Result Value Ref Range   Glucose 99 65 - 99 mg/dL   BUN 17 6 - 24 mg/dL   Creatinine, Ser 1.16 0.76 - 1.27 mg/dL   GFR calc non Af Amer 73 >59 mL/min/1.73   GFR calc Af Amer 84 >59 mL/min/1.73   BUN/Creatinine Ratio 15 9 - 20   Sodium 142 134 - 144 mmol/L   Potassium 4.2 3.5 - 5.2 mmol/L   Chloride 103 96 - 106 mmol/L   CO2 20 20 - 29 mmol/L   Calcium 9.4 8.7 - 10.2 mg/dL   Total Protein 6.9 6.0 - 8.5 g/dL   Albumin 4.5 3.5 - 5.5 g/dL   Globulin, Total 2.4 1.5 - 4.5 g/dL   Albumin/Globulin Ratio 1.9 1.2 - 2.2   Bilirubin Total <0.2 0.0 - 1.2 mg/dL   Alkaline Phosphatase 53 39 - 117 IU/L   AST 26 0 - 40 IU/L   ALT 37 0 - 44 IU/L  Hemoglobin A1c  Result Value Ref Range   Hgb A1c MFr Bld 5.1 4.8 - 5.6 %  Est. 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

## 2018-06-10 ENCOUNTER — Telehealth: Payer: Self-pay | Admitting: Family Medicine

## 2018-06-10 NOTE — Telephone Encounter (Signed)
Patient is aware 

## 2018-06-10 NOTE — Telephone Encounter (Signed)
His EKG look good.  I reviewed over the physician's note.  Lab work overall looks good except triglycerides mildly elevated but not in a range that requires any medication.  Healthy diet regular activity is the best approach.  HDL-which is the good cholesterol is low but this is more than likely genetic.  LDL which is the bad cholesterol was well under 100.  It would be wise to repeat lab work again in 1 years time.

## 2018-06-10 NOTE — Telephone Encounter (Signed)
Patient wanted to let Dr. Nicki Reaper know that he had an EKG and blood work done through Dr. Rance Muir office who is on Epic.  He would like for Dr. Nicki Reaper to review these results when he gets a chance.

## 2018-06-22 ENCOUNTER — Other Ambulatory Visit (INDEPENDENT_AMBULATORY_CARE_PROVIDER_SITE_OTHER): Payer: Self-pay | Admitting: Family Medicine

## 2018-06-22 DIAGNOSIS — E559 Vitamin D deficiency, unspecified: Secondary | ICD-10-CM

## 2018-06-23 ENCOUNTER — Ambulatory Visit (INDEPENDENT_AMBULATORY_CARE_PROVIDER_SITE_OTHER): Payer: 59 | Admitting: Family Medicine

## 2018-06-23 VITALS — BP 124/81 | HR 65 | Ht 72.0 in | Wt 236.0 lb

## 2018-06-23 DIAGNOSIS — J3081 Allergic rhinitis due to animal (cat) (dog) hair and dander: Secondary | ICD-10-CM | POA: Diagnosis not present

## 2018-06-23 DIAGNOSIS — M545 Low back pain, unspecified: Secondary | ICD-10-CM

## 2018-06-23 DIAGNOSIS — J301 Allergic rhinitis due to pollen: Secondary | ICD-10-CM | POA: Diagnosis not present

## 2018-06-23 DIAGNOSIS — E669 Obesity, unspecified: Secondary | ICD-10-CM

## 2018-06-23 DIAGNOSIS — E559 Vitamin D deficiency, unspecified: Secondary | ICD-10-CM

## 2018-06-23 DIAGNOSIS — Z6831 Body mass index (BMI) 31.0-31.9, adult: Secondary | ICD-10-CM

## 2018-06-23 DIAGNOSIS — Z9189 Other specified personal risk factors, not elsewhere classified: Secondary | ICD-10-CM | POA: Diagnosis not present

## 2018-06-23 DIAGNOSIS — G8929 Other chronic pain: Secondary | ICD-10-CM

## 2018-06-23 DIAGNOSIS — J3089 Other allergic rhinitis: Secondary | ICD-10-CM | POA: Diagnosis not present

## 2018-06-23 MED ORDER — VITAMIN D (ERGOCALCIFEROL) 1.25 MG (50000 UNIT) PO CAPS: 50000.0000 [IU] | ORAL_CAPSULE | ORAL | 0 refills | Status: DC

## 2018-06-24 DIAGNOSIS — M5442 Lumbago with sciatica, left side: Secondary | ICD-10-CM | POA: Diagnosis not present

## 2018-06-24 DIAGNOSIS — B078 Other viral warts: Secondary | ICD-10-CM | POA: Diagnosis not present

## 2018-06-24 DIAGNOSIS — M5137 Other intervertebral disc degeneration, lumbosacral region: Secondary | ICD-10-CM | POA: Diagnosis not present

## 2018-06-24 DIAGNOSIS — M9903 Segmental and somatic dysfunction of lumbar region: Secondary | ICD-10-CM | POA: Diagnosis not present

## 2018-06-24 DIAGNOSIS — C44319 Basal cell carcinoma of skin of other parts of face: Secondary | ICD-10-CM | POA: Diagnosis not present

## 2018-06-24 NOTE — Progress Notes (Signed)
Office: [phone]  /  Fax: [phone]   HPI:   Chief Complaint: OBESITY Darrell Mckinney is here to discuss his progress with his obesity treatment plan. He is on the lower carbohydrate, vegetable and lean protein rich diet plan and is following his eating plan approximately 97 % of the time. He states he is doing cardio and weights for 120 minutes 3-4 times per week. Soma is doing well with weight loss but is discouraged that his weight loss isn't faster. He has been educated on reasonable expectations previously and we repeated those expectations.  His weight is 236 lb (107 kg) today and has had a weight loss of 5 pounds over a period of 3 weeks since his last visit. He has lost 5 lbs since starting treatment with [name].  Vitamin D Deficiency Darrell Mckinney has a diagnosis of vitamin D deficiency. He is on prescription Vit D and is now at goal. He denies nausea, vomiting or muscle weakness.  Lower Back Pain Darrell Mckinney had a flare up of low back pain approximately 4 days ago. It had been improving but seems to worsen wen he sits down too long.   At risk for cardiovascular disease Darrell Mckinney is at a higher than average risk for cardiovascular disease due to obesity. He currently denies any chest pain.  ALLERGIES: No Known Allergies  MEDICATIONS: Current Outpatient Medications on File Prior to Visit  Medication Sig Dispense Refill  . B Complex Vitamins (VITAMIN B COMPLEX PO) Take 1 tablet by mouth daily.    Marland Kitchen ibuprofen (ADVIL,MOTRIN) 800 MG tablet Take 800 mg by mouth every 6 (six) hours as needed.     . montelukast (SINGULAIR) 10 MG tablet Take 10 mg by mouth daily.     . pantoprazole (PROTONIX) 40 MG tablet Take 40 mg by mouth daily.     No current facility-administered medications on file prior to visit.     PAST MEDICAL HISTORY: Past Medical History:  Diagnosis Date  . Acid reflux   . Back pain   . Bulging lumbar disc   . Fatigue   . GERD (gastroesophageal reflux disease)   . Hay fever    . Joint pain   . Muscle pain   . Sciatic pain   . Seasonal allergies     PAST SURGICAL HISTORY: Past Surgical History:  Procedure Laterality Date  . BACK SURGERY  ~2006  . COLONOSCOPY  12/2008   RMR: normal  . COLONOSCOPY N/A 10/26/2015   Procedure: COLONOSCOPY;  Surgeon: Daneil Dolin, MD;  [facility];  Service: Endoscopy;  Laterality: N/A;  11:15 - moved to 12:00 - pt knows to arrive at 11:00  . TURBINATE RESECTION Bilateral 01/25/2013   Procedure: BILATERAL TURBINATE RESECTION;  Surgeon: Ascencion Dike, MD;  Location: [facility];  Service: ENT;  Laterality: Bilateral;    SOCIAL HISTORY: Social History   Tobacco Use  . Smoking status: Never Smoker  . Smokeless tobacco: Never Used  Substance Use Topics  . Alcohol use: Yes    Alcohol/week: 0.0 oz    Comment: six pack per week  . Drug use: No    FAMILY HISTORY: Family History  Problem Relation Age of Onset  . Colon cancer Father        dx age 18, deceased age 65  . Colon cancer Paternal Uncle        age 75s  . Stroke Mother     ROS: Review of Systems  Constitutional: Positive for weight  loss.  Cardiovascular: Negative for chest pain.  Gastrointestinal: Negative for nausea and vomiting.  Musculoskeletal: Positive for back pain (lower).       Negative muscle weakness    PHYSICAL EXAM: Blood pressure 124/81, pulse 65, height 6' (1.829 m), weight 236 lb (107 kg), SpO2 96 %. Body mass index is 32.01 kg/m. Physical Exam  Constitutional: He is oriented to person, place, and time. He appears well-developed and well-nourished.  Cardiovascular: Normal rate.  Pulmonary/Chest: Effort normal.  Musculoskeletal: Normal range of motion.  Neurological: He is oriented to person, place, and time.  Skin: Skin is warm and dry.  Psychiatric: He has a normal mood and affect. His behavior is normal.  Vitals reviewed.   RECENT LABS AND TESTS: BMET    Component Value Date/Time   NA 142 06/02/2018 0952    K 4.2 06/02/2018 0952   CL 103 06/02/2018 0952   CO2 20 06/02/2018 0952   GLUCOSE 99 06/02/2018 0952   BUN 17 06/02/2018 0952   CREATININE 1.16 06/02/2018 0952   CALCIUM 9.4 06/02/2018 0952   GFRNONAA 73 06/02/2018 0952   GFRAA 84 06/02/2018 0952   Lab Results  Component Value Date   HGBA1C 5.1 06/02/2018   HGBA1C 5.1 02/08/2018   Lab Results  Component Value Date   INSULIN 24.1 06/02/2018   INSULIN 18.8 02/08/2018   CBC    Component Value Date/Time   WBC 5.5 02/08/2018 0943   RBC 4.71 02/08/2018 0943   HGB 15.6 02/08/2018 0943   HCT 46.3 02/08/2018 0943   MCV 98 (H) 02/08/2018 0943   MCH 33.1 (H) 02/08/2018 0943   MCHC 33.7 02/08/2018 0943   RDW 12.7 02/08/2018 0943   LYMPHSABS 1.8 02/08/2018 0943   EOSABS 0.1 02/08/2018 0943   BASOSABS 0.0 02/08/2018 0943   Iron/TIBC/Ferritin/ %Sat No results found for: IRON, TIBC, FERRITIN, IRONPCTSAT Lipid Panel     Component Value Date/Time   CHOL 135 06/02/2018 0952   TRIG 192 (H) 06/02/2018 0952   HDL 34 (L) 06/02/2018 0952   LDLCALC 63 06/02/2018 0952   Hepatic Function Panel     Component Value Date/Time   PROT 6.9 06/02/2018 0952   ALBUMIN 4.5 06/02/2018 0952   AST 26 06/02/2018 0952   ALT 37 06/02/2018 0952   ALKPHOS 53 06/02/2018 0952   BILITOT <0.2 06/02/2018 0952      Component Value Date/Time   TSH 1.740 02/08/2018 0943  Results for TOME, WILSON (MRN [id number]) as of 06/24/2018 11:39  Ref. Range 06/02/2018 09:52  Vitamin D, 25-Hydroxy Latest Ref Range: 30.0 - 100.0 ng/mL 58.1    ASSESSMENT AND PLAN: Vitamin D deficiency - Plan: Vitamin D, Ergocalciferol, (DRISDOL) 50000 units CAPS capsule  Chronic low back pain without sciatica, unspecified back pain laterality  At risk for heart disease  Class 1 obesity with serious comorbidity and body mass index (BMI) of 31.0 to 31.9 in adult, unspecified obesity type  PLAN:  Vitamin D Deficiency Darrell Mckinney was informed that low vitamin D levels contributes  to fatigue and are associated with obesity, breast, and colon cancer. Darrell Mckinney agrees to continue taking prescription Vit D @50 ,000 IU every week #4 and we will refill for 1 month to maintain goal. He will follow up for routine testing of vitamin D, at least 2-3 times per year. He was informed of the risk of over-replacement of vitamin D and agrees to not increase his dose unless he discusses this with [name] first. Sota agrees to follow up  with our clinic in 4 weeks.  Lower Back Pain We discussed lower back exercises to help stretch the back. OTC NSAIDS 400 mg ibuprofen recommended versus 800 mg prescription ibuprofen for renal protection. Aashir agrees to follow up with our clinic in 4 weeks.  Cardiovascular risk counselling Jovanne was given extended (15 minutes) coronary artery disease prevention counseling today. He is 52 y.o. male and has risk factors for heart disease including obesity. We discussed intensive lifestyle modifications today with an emphasis on specific weight loss instructions and strategies. Pt was also informed of the importance of increasing exercise and decreasing saturated fats to help prevent heart disease.  Obesity Paulino is currently in the action stage of change. As such, his goal is to continue with weight loss efforts He has agreed to portion control better and make smarter food choices, such as increase vegetables and decrease simple carbohydrates  Yahir has been instructed to work up to a goal of 150 minutes of combined cardio and strengthening exercise per week for weight loss and overall health benefits. We discussed the following Behavioral Modification Strategies today: decreasing simple carbohydrates  and increasing vegetables   Kinte has agreed to follow up with our clinic in 4 weeks. He was informed of the importance of frequent follow up visits to maximize his success with intensive lifestyle modifications for his multiple health conditions.   OBESITY  BEHAVIORAL INTERVENTION VISIT  Today's visit was # 6 out of 22.  Starting weight: 241 lbs Starting date: 02/08/18 Today's weight : 236 lbs  Today's date: 06/23/2018 Total lbs lost to date: 5    ASK: We discussed the diagnosis of obesity with [name] today and Legrand Como agreed to give [name] permission to discuss obesity behavioral modification therapy today.  ASSESS: Krystofer has the diagnosis of obesity and his BMI today is 53 Shamar is in the action stage of change   ADVISE: Candler was educated on the multiple health risks of obesity as well as the benefit of weight loss to improve his health. He was advised of the need for long term treatment and the importance of lifestyle modifications.  AGREE: Multiple dietary modification options and treatment options were discussed and  Kaden agreed to the above obesity treatment plan.  I, Trixie Dredge, am acting as transcriptionist for Dennard Nip, MD  I have reviewed the above documentation for accuracy and completeness, and I agree with the above. -Dennard Nip, MD

## 2018-06-25 DIAGNOSIS — M5137 Other intervertebral disc degeneration, lumbosacral region: Secondary | ICD-10-CM | POA: Diagnosis not present

## 2018-06-25 DIAGNOSIS — M9903 Segmental and somatic dysfunction of lumbar region: Secondary | ICD-10-CM | POA: Diagnosis not present

## 2018-06-25 DIAGNOSIS — M5442 Lumbago with sciatica, left side: Secondary | ICD-10-CM | POA: Diagnosis not present

## 2018-06-29 DIAGNOSIS — J3089 Other allergic rhinitis: Secondary | ICD-10-CM | POA: Diagnosis not present

## 2018-06-29 DIAGNOSIS — J3081 Allergic rhinitis due to animal (cat) (dog) hair and dander: Secondary | ICD-10-CM | POA: Diagnosis not present

## 2018-06-29 DIAGNOSIS — J301 Allergic rhinitis due to pollen: Secondary | ICD-10-CM | POA: Diagnosis not present

## 2018-06-30 DIAGNOSIS — M5442 Lumbago with sciatica, left side: Secondary | ICD-10-CM | POA: Diagnosis not present

## 2018-06-30 DIAGNOSIS — M5137 Other intervertebral disc degeneration, lumbosacral region: Secondary | ICD-10-CM | POA: Diagnosis not present

## 2018-06-30 DIAGNOSIS — M9903 Segmental and somatic dysfunction of lumbar region: Secondary | ICD-10-CM | POA: Diagnosis not present

## 2018-07-02 DIAGNOSIS — M5442 Lumbago with sciatica, left side: Secondary | ICD-10-CM | POA: Diagnosis not present

## 2018-07-02 DIAGNOSIS — M9903 Segmental and somatic dysfunction of lumbar region: Secondary | ICD-10-CM | POA: Diagnosis not present

## 2018-07-02 DIAGNOSIS — J3081 Allergic rhinitis due to animal (cat) (dog) hair and dander: Secondary | ICD-10-CM | POA: Diagnosis not present

## 2018-07-02 DIAGNOSIS — M5137 Other intervertebral disc degeneration, lumbosacral region: Secondary | ICD-10-CM | POA: Diagnosis not present

## 2018-07-02 DIAGNOSIS — J3089 Other allergic rhinitis: Secondary | ICD-10-CM | POA: Diagnosis not present

## 2018-07-02 DIAGNOSIS — J301 Allergic rhinitis due to pollen: Secondary | ICD-10-CM | POA: Diagnosis not present

## 2018-07-12 DIAGNOSIS — J3089 Other allergic rhinitis: Secondary | ICD-10-CM | POA: Diagnosis not present

## 2018-07-12 DIAGNOSIS — J3081 Allergic rhinitis due to animal (cat) (dog) hair and dander: Secondary | ICD-10-CM | POA: Diagnosis not present

## 2018-07-12 DIAGNOSIS — M9903 Segmental and somatic dysfunction of lumbar region: Secondary | ICD-10-CM | POA: Diagnosis not present

## 2018-07-12 DIAGNOSIS — J301 Allergic rhinitis due to pollen: Secondary | ICD-10-CM | POA: Diagnosis not present

## 2018-07-12 DIAGNOSIS — M5137 Other intervertebral disc degeneration, lumbosacral region: Secondary | ICD-10-CM | POA: Diagnosis not present

## 2018-07-12 DIAGNOSIS — M5442 Lumbago with sciatica, left side: Secondary | ICD-10-CM | POA: Diagnosis not present

## 2018-07-16 DIAGNOSIS — M9903 Segmental and somatic dysfunction of lumbar region: Secondary | ICD-10-CM | POA: Diagnosis not present

## 2018-07-16 DIAGNOSIS — M5137 Other intervertebral disc degeneration, lumbosacral region: Secondary | ICD-10-CM | POA: Diagnosis not present

## 2018-07-16 DIAGNOSIS — M5442 Lumbago with sciatica, left side: Secondary | ICD-10-CM | POA: Diagnosis not present

## 2018-07-16 DIAGNOSIS — J3081 Allergic rhinitis due to animal (cat) (dog) hair and dander: Secondary | ICD-10-CM | POA: Diagnosis not present

## 2018-07-16 DIAGNOSIS — J3089 Other allergic rhinitis: Secondary | ICD-10-CM | POA: Diagnosis not present

## 2018-07-19 DIAGNOSIS — J3081 Allergic rhinitis due to animal (cat) (dog) hair and dander: Secondary | ICD-10-CM | POA: Diagnosis not present

## 2018-07-19 DIAGNOSIS — J3089 Other allergic rhinitis: Secondary | ICD-10-CM | POA: Diagnosis not present

## 2018-07-19 DIAGNOSIS — J301 Allergic rhinitis due to pollen: Secondary | ICD-10-CM | POA: Diagnosis not present

## 2018-07-20 ENCOUNTER — Ambulatory Visit (INDEPENDENT_AMBULATORY_CARE_PROVIDER_SITE_OTHER): Payer: 59 | Admitting: Family Medicine

## 2018-07-20 VITALS — BP 106/62 | HR 79 | Temp 98.0°F | Ht 72.0 in | Wt 236.0 lb

## 2018-07-20 DIAGNOSIS — Z9189 Other specified personal risk factors, not elsewhere classified: Secondary | ICD-10-CM

## 2018-07-20 DIAGNOSIS — E669 Obesity, unspecified: Secondary | ICD-10-CM

## 2018-07-20 DIAGNOSIS — E559 Vitamin D deficiency, unspecified: Secondary | ICD-10-CM | POA: Diagnosis not present

## 2018-07-20 DIAGNOSIS — Z6832 Body mass index (BMI) 32.0-32.9, adult: Secondary | ICD-10-CM

## 2018-07-20 MED ORDER — VITAMIN D (ERGOCALCIFEROL) 1.25 MG (50000 UNIT) PO CAPS: 50000.0000 [IU] | ORAL_CAPSULE | ORAL | 0 refills | Status: DC

## 2018-07-20 NOTE — Progress Notes (Signed)
Office: [phone]  /  Fax: [phone]   HPI:   Chief Complaint: OBESITY Darrell Mckinney is here to discuss his progress with his obesity treatment plan. He is on the lower carbohydrate, vegetable and lean protein rich diet plan and is following his eating plan approximately 90 % of the time. He states he is exercising at the gym 2 hours  3 to 4 times per week. Darrell Mckinney has maintained his weight well over the last two to three weeks. He is doing a low carb meal plan, but he is still deviating with beer and some bread. His weight is 236 lb (107 kg) today and has not lost weight since his last visit. He has lost 15 lbs since starting treatment with [name].  Vitamin D deficiency Darrell Mckinney has a diagnosis of vitamin D deficiency. He is currently taking vit D and his level is now at goal. Fatigue has improved and he denies nausea, vomiting or muscle weakness.  At risk for osteopenia and osteoporosis Darrell Mckinney is at higher risk of osteopenia and osteoporosis due to vitamin D deficiency.   ALLERGIES: No Known Allergies  MEDICATIONS: Current Outpatient Medications on File Prior to Visit  Medication Sig Dispense Refill  . B Complex Vitamins (VITAMIN B COMPLEX PO) Take 1 tablet by mouth daily.    Marland Kitchen ibuprofen (ADVIL,MOTRIN) 800 MG tablet Take 800 mg by mouth every 6 (six) hours as needed.     . montelukast (SINGULAIR) 10 MG tablet Take 10 mg by mouth daily.     . pantoprazole (PROTONIX) 40 MG tablet Take 40 mg by mouth daily.     No current facility-administered medications on file prior to visit.     PAST MEDICAL HISTORY: Past Medical History:  Diagnosis Date  . Acid reflux   . Back pain   . Bulging lumbar disc   . Fatigue   . GERD (gastroesophageal reflux disease)   . Hay fever   . Joint pain   . Muscle pain   . Sciatic pain   . Seasonal allergies     PAST SURGICAL HISTORY: Past Surgical History:  Procedure Laterality Date  . BACK SURGERY  ~2006  . COLONOSCOPY  12/2008   RMR: normal    . COLONOSCOPY N/A 10/26/2015   Procedure: COLONOSCOPY;  Surgeon: Daneil Dolin, MD;  [facility];  Service: Endoscopy;  Laterality: N/A;  11:15 - moved to 12:00 - pt knows to arrive at 11:00  . TURBINATE RESECTION Bilateral 01/25/2013   Procedure: BILATERAL TURBINATE RESECTION;  Surgeon: Ascencion Dike, MD;  Location: [facility];  Service: ENT;  Laterality: Bilateral;    SOCIAL HISTORY: Social History   Tobacco Use  . Smoking status: Never Smoker  . Smokeless tobacco: Never Used  Substance Use Topics  . Alcohol use: Yes    Alcohol/week: 0.0 standard drinks    Comment: six pack per week  . Drug use: No    FAMILY HISTORY: Family History  Problem Relation Age of Onset  . Colon cancer Father        dx age 9, deceased age 37  . Colon cancer Paternal Uncle        age 66s  . Stroke Mother     ROS: Review of Systems  Constitutional: Positive for malaise/fatigue. Negative for weight loss.  Gastrointestinal: Negative for nausea and vomiting.  Musculoskeletal:       Negative for muscle weakness    PHYSICAL EXAM: Blood pressure 106/62, pulse 79, temperature 98  F (36.7 C), temperature source Oral, height 6' (1.829 m), weight 236 lb (107 kg), SpO2 97 %. Body mass index is 32.01 kg/m. Physical Exam  Constitutional: He is oriented to person, place, and time. He appears well-developed and well-nourished.  Cardiovascular: Normal rate.  Pulmonary/Chest: Effort normal.  Musculoskeletal: Normal range of motion.  Neurological: He is oriented to person, place, and time.  Skin: Skin is warm and dry.  Psychiatric: He has a normal mood and affect. His behavior is normal.  Vitals reviewed.   RECENT LABS AND TESTS: BMET    Component Value Date/Time   NA 142 06/02/2018 0952   K 4.2 06/02/2018 0952   CL 103 06/02/2018 0952   CO2 20 06/02/2018 0952   GLUCOSE 99 06/02/2018 0952   BUN 17 06/02/2018 0952   CREATININE 1.16 06/02/2018 0952   CALCIUM 9.4 06/02/2018  0952   GFRNONAA 73 06/02/2018 0952   GFRAA 84 06/02/2018 0952   Lab Results  Component Value Date   HGBA1C 5.1 06/02/2018   HGBA1C 5.1 02/08/2018   Lab Results  Component Value Date   INSULIN 24.1 06/02/2018   INSULIN 18.8 02/08/2018   CBC    Component Value Date/Time   WBC 5.5 02/08/2018 0943   RBC 4.71 02/08/2018 0943   HGB 15.6 02/08/2018 0943   HCT 46.3 02/08/2018 0943   MCV 98 (H) 02/08/2018 0943   MCH 33.1 (H) 02/08/2018 0943   MCHC 33.7 02/08/2018 0943   RDW 12.7 02/08/2018 0943   LYMPHSABS 1.8 02/08/2018 0943   EOSABS 0.1 02/08/2018 0943   BASOSABS 0.0 02/08/2018 0943   Iron/TIBC/Ferritin/ %Sat No results found for: IRON, TIBC, FERRITIN, IRONPCTSAT Lipid Panel     Component Value Date/Time   CHOL 135 06/02/2018 0952   TRIG 192 (H) 06/02/2018 0952   HDL 34 (L) 06/02/2018 0952   LDLCALC 63 06/02/2018 0952   Hepatic Function Panel     Component Value Date/Time   PROT 6.9 06/02/2018 0952   ALBUMIN 4.5 06/02/2018 0952   AST 26 06/02/2018 0952   ALT 37 06/02/2018 0952   ALKPHOS 53 06/02/2018 0952   BILITOT <0.2 06/02/2018 0952      Component Value Date/Time   TSH 1.740 02/08/2018 0943   Results for ZAQUAN, DUFFNER (MRN [id number]) as of 07/20/2018 17:01  Ref. Range 06/02/2018 09:52  Vitamin D, 25-Hydroxy Latest Ref Range: 30.0 - 100.0 ng/mL 58.1   ASSESSMENT AND PLAN: Vitamin D deficiency - Plan: Vitamin D, Ergocalciferol, (DRISDOL) 50000 units CAPS capsule  At risk for osteoporosis  Class 1 obesity with serious comorbidity and body mass index (BMI) of 32.0 to 32.9 in adult, unspecified obesity type  PLAN:  Vitamin D Deficiency Darrell Mckinney was informed that low vitamin D levels contributes to fatigue and are associated with obesity, breast, and colon cancer. He agrees to continue to take prescription Vit D @50 ,000 IU every week #4 with no refills and will follow up for routine testing of vitamin D, at least 2-3 times per year. He was informed of the risk  of over-replacement of vitamin D and agrees to not increase his dose unless he discusses this with [name] first. We will recheck labs in 2 weeks and Darrell Mckinney will follow up as directed.  At risk for osteopenia and osteoporosis Darrell Mckinney is at risk for osteopenia and osteoporosis due to his vitamin D deficiency. He was encouraged to take his vitamin D and follow his higher calcium diet and increase strengthening exercise to help strengthen his  bones and decrease his risk of osteopenia and osteoporosis.  Obesity Darrell Mckinney is currently in the action stage of change. As such, his goal is to continue with weight loss efforts He has agreed to change to keeping a food journal with 1500 calories and 90+ grams of protein daily Darrell Mckinney has been instructed to work up to a goal of 150 minutes of combined cardio and strengthening exercise per week for weight loss and overall health benefits. We discussed the following Behavioral Modification Strategies today: keep a strict food journal, increasing lean protein intake and work on meal planning and easy cooking plans  Darrell Mckinney has agreed to follow up with our clinic in 3 weeks. He was informed of the importance of frequent follow up visits to maximize his success with intensive lifestyle modifications for his multiple health conditions.   OBESITY BEHAVIORAL INTERVENTION VISIT  Today's visit was # 7   Starting weight: 251 lbs Starting date: 02/08/18 Today's weight : 236 lbs  Today's date: 07/20/2018 Total lbs lost to date: 15   ASK: We discussed the diagnosis of obesity with Darrell Mckinney today and Darrell Mckinney agreed to give [name] permission to discuss obesity behavioral modification therapy today.  ASSESS: Darrell Mckinney has the diagnosis of obesity and his BMI today is 74 Darrell Mckinney is in the action stage of change   ADVISE: Darrell Mckinney was educated on the multiple health risks of obesity as well as the benefit of weight loss to improve his health. He was advised of the need  for long term treatment and the importance of lifestyle modifications to improve his current health and to decrease his risk of future health problems.  AGREE: Multiple dietary modification options and treatment options were discussed and  Darrell Mckinney agreed to follow the recommendations documented in the above note.  ARRANGE: Darrell Mckinney was educated on the importance of frequent visits to treat obesity as outlined per CMS and USPSTF guidelines and agreed to schedule his next follow up appointment today.  I, Doreene Nest, am acting as transcriptionist for Dennard Nip, MD  I have reviewed the above documentation for accuracy and completeness, and I agree with the above. -Dennard Nip, MD

## 2018-07-23 DIAGNOSIS — J3089 Other allergic rhinitis: Secondary | ICD-10-CM | POA: Diagnosis not present

## 2018-07-23 DIAGNOSIS — J301 Allergic rhinitis due to pollen: Secondary | ICD-10-CM | POA: Diagnosis not present

## 2018-07-23 DIAGNOSIS — J3081 Allergic rhinitis due to animal (cat) (dog) hair and dander: Secondary | ICD-10-CM | POA: Diagnosis not present

## 2018-07-30 DIAGNOSIS — J3089 Other allergic rhinitis: Secondary | ICD-10-CM | POA: Diagnosis not present

## 2018-07-30 DIAGNOSIS — J301 Allergic rhinitis due to pollen: Secondary | ICD-10-CM | POA: Diagnosis not present

## 2018-07-30 DIAGNOSIS — M5442 Lumbago with sciatica, left side: Secondary | ICD-10-CM | POA: Diagnosis not present

## 2018-07-30 DIAGNOSIS — J3081 Allergic rhinitis due to animal (cat) (dog) hair and dander: Secondary | ICD-10-CM | POA: Diagnosis not present

## 2018-07-30 DIAGNOSIS — M9903 Segmental and somatic dysfunction of lumbar region: Secondary | ICD-10-CM | POA: Diagnosis not present

## 2018-07-30 DIAGNOSIS — M5137 Other intervertebral disc degeneration, lumbosacral region: Secondary | ICD-10-CM | POA: Diagnosis not present

## 2018-08-08 ENCOUNTER — Encounter: Payer: Self-pay | Admitting: Family Medicine

## 2018-08-08 DIAGNOSIS — B029 Zoster without complications: Secondary | ICD-10-CM | POA: Diagnosis not present

## 2018-08-09 ENCOUNTER — Encounter: Payer: Self-pay | Admitting: Family Medicine

## 2018-08-09 ENCOUNTER — Other Ambulatory Visit: Payer: Self-pay | Admitting: Family Medicine

## 2018-08-09 ENCOUNTER — Ambulatory Visit (INDEPENDENT_AMBULATORY_CARE_PROVIDER_SITE_OTHER): Payer: 59 | Admitting: Family Medicine

## 2018-08-09 VITALS — BP 138/86 | Temp 98.1°F | Ht 72.0 in | Wt 239.6 lb

## 2018-08-09 DIAGNOSIS — B029 Zoster without complications: Secondary | ICD-10-CM

## 2018-08-09 MED ORDER — ZOSTER VAC RECOMB ADJUVANTED 50 MCG/0.5ML IM SUSR: 0.5000 mL | Freq: Once | INTRAMUSCULAR | 1 refills | Status: AC

## 2018-08-09 NOTE — Progress Notes (Signed)
   Subjective:    Patient ID: Darrell Mckinney, male    DOB: 08-23-1966, 52 y.o.   MRN: [id number]  Rash  This is a new problem. The current episode started in the past 7 days. The affected locations include the back and left upper leg. The rash is characterized by blistering, itchiness and redness. Associated symptoms include joint pain. Treatments tried: went to Urgent Care yesterday.   tue got a rash felt irritated  Got to hurting uncomfortable,  Developed rash, rasied in nature  Blistering painful   wnt to urgicar  Started on valtrex 1 g tid ,,     Review of Systems  Musculoskeletal: Positive for joint pain.  Skin: Positive for rash.       Objective:   Physical Exam  Alert vitals stable, NAD. Blood pressure good on repeat. HEENT normal. Lungs clear. Heart regular rate and rhythm. Classic shingles eruption left posterior flank radiating anteriorly      Assessment & Plan:  Impression shingles.  Long discussion held.  Questions answered regarding management of pain.  Pain is not that bad at this time.  Multiple questions regarding vaccines answered.  Shingrix prescription written.  Expect gradual resolution with current meds on Valtrex already

## 2018-08-11 ENCOUNTER — Other Ambulatory Visit (INDEPENDENT_AMBULATORY_CARE_PROVIDER_SITE_OTHER): Payer: Self-pay | Admitting: Family Medicine

## 2018-08-11 DIAGNOSIS — E559 Vitamin D deficiency, unspecified: Secondary | ICD-10-CM

## 2018-08-16 ENCOUNTER — Ambulatory Visit (INDEPENDENT_AMBULATORY_CARE_PROVIDER_SITE_OTHER): Payer: 59 | Admitting: Family Medicine

## 2018-08-16 VITALS — BP 113/69 | HR 49 | Temp 97.9°F | Ht 73.0 in | Wt 231.0 lb

## 2018-08-16 DIAGNOSIS — E559 Vitamin D deficiency, unspecified: Secondary | ICD-10-CM | POA: Diagnosis not present

## 2018-08-16 DIAGNOSIS — E669 Obesity, unspecified: Secondary | ICD-10-CM

## 2018-08-16 DIAGNOSIS — E782 Mixed hyperlipidemia: Secondary | ICD-10-CM | POA: Diagnosis not present

## 2018-08-16 DIAGNOSIS — Z9189 Other specified personal risk factors, not elsewhere classified: Secondary | ICD-10-CM | POA: Diagnosis not present

## 2018-08-16 DIAGNOSIS — Z6830 Body mass index (BMI) 30.0-30.9, adult: Secondary | ICD-10-CM

## 2018-08-16 MED ORDER — VITAMIN D (ERGOCALCIFEROL) 1.25 MG (50000 UNIT) PO CAPS: 50000.0000 [IU] | ORAL_CAPSULE | ORAL | 0 refills | Status: DC

## 2018-08-17 NOTE — Progress Notes (Signed)
Office: [phone]  /  Fax: [phone]   HPI:   Chief Complaint: OBESITY Darrell Mckinney is here to discuss his progress with his obesity treatment plan. He is on the  keep a food journal with 1500 calories and 90+ g of protein plan and is following his eating plan approximately 85-90 % of the time. He states he is exercising by doing cardio and weight lifting for 120 minutes 3-4 times per week. Darrell Mckinney was changed to journal plan last visit as we was not following low carbohydrate plan closely. He states he does well during the week but struggles on the weekend.  His weight is 231 lb (104.8 kg) today and has had a weight loss of 5 pounds over a period of 4 weeks since his last visit. He has lost 20 lbs since starting treatment with [name].  Vitamin D deficiency Darrell Mckinney has a diagnosis of vitamin D deficiency. He is currently taking vit D but is not yet at goal and denies nausea, vomiting or muscle weakness.   Ref. Range 06/02/2018 09:52  Vitamin D, 25-Hydroxy Latest Ref Range: 30.0 - 100.0 ng/mL 58.1   Hyperlipidemia (Mixed) Darrell Mckinney has hyperlipidemia and has been trying to improve his cholesterol levels with intensive lifestyle modification including a low saturated fat diet, exercise and weight loss. He denies any chest pain, claudication or myalgias.  At risk for cardiovascular disease Darrell Mckinney is at a higher than average risk for cardiovascular disease due to obesity. He currently denies any chest pain.  ALLERGIES: No Known Allergies  MEDICATIONS: Current Outpatient Medications on File Prior to Visit  Medication Sig Dispense Refill  . B Complex Vitamins (VITAMIN B COMPLEX PO) Take 1 tablet by mouth daily.    Marland Kitchen ibuprofen (ADVIL,MOTRIN) 800 MG tablet Take 800 mg by mouth every 6 (six) hours as needed.     . montelukast (SINGULAIR) 10 MG tablet Take 10 mg by mouth daily.     . pantoprazole (PROTONIX) 40 MG tablet Take 40 mg by mouth daily.     No current facility-administered medications  on file prior to visit.     PAST MEDICAL HISTORY: Past Medical History:  Diagnosis Date  . Acid reflux   . Back pain   . Bulging lumbar disc   . Fatigue   . GERD (gastroesophageal reflux disease)   . Hay fever   . Joint pain   . Muscle pain   . Sciatic pain   . Seasonal allergies     PAST SURGICAL HISTORY: Past Surgical History:  Procedure Laterality Date  . BACK SURGERY  ~2006  . COLONOSCOPY  12/2008   RMR: normal  . COLONOSCOPY N/A 10/26/2015   Procedure: COLONOSCOPY;  Surgeon: Daneil Dolin, MD;  [facility];  Service: Endoscopy;  Laterality: N/A;  11:15 - moved to 12:00 - pt knows to arrive at 11:00  . TURBINATE RESECTION Bilateral 01/25/2013   Procedure: BILATERAL TURBINATE RESECTION;  Surgeon: Ascencion Dike, MD;  Location: [facility];  Service: ENT;  Laterality: Bilateral;    SOCIAL HISTORY: Social History   Tobacco Use  . Smoking status: Never Smoker  . Smokeless tobacco: Never Used  Substance Use Topics  . Alcohol use: Yes    Alcohol/week: 0.0 standard drinks    Comment: six pack per week  . Drug use: No    FAMILY HISTORY: Family History  Problem Relation Age of Onset  . Colon cancer Father        dx age 56,  deceased age 35  . Colon cancer Paternal Uncle        age 96s  . Stroke Mother     ROS: Review of Systems  Constitutional: Positive for weight loss.  Cardiovascular: Negative for palpitations and claudication.  Gastrointestinal: Negative for nausea and vomiting.  Musculoskeletal: Negative for myalgias.       Negative for muscle weakness    PHYSICAL EXAM: Blood pressure 113/69, pulse (!) 49, temperature 97.9 F (36.6 C), temperature source Oral, height 6\' 1"  (1.854 m), weight 231 lb (104.8 kg), SpO2 97 %. Body mass index is 30.48 kg/m. Physical Exam  Constitutional: He is oriented to person, place, and time. He appears well-developed and well-nourished.  HENT:  Head: Normocephalic.  Cardiovascular: Normal rate.    Pulmonary/Chest: Effort normal.  Musculoskeletal: Normal range of motion.  Neurological: He is oriented to person, place, and time.  Skin: Skin is warm and dry.  Psychiatric: He has a normal mood and affect. His behavior is normal.  Vitals reviewed.   RECENT LABS AND TESTS: BMET    Component Value Date/Time   NA 142 06/02/2018 0952   K 4.2 06/02/2018 0952   CL 103 06/02/2018 0952   CO2 20 06/02/2018 0952   GLUCOSE 99 06/02/2018 0952   BUN 17 06/02/2018 0952   CREATININE 1.16 06/02/2018 0952   CALCIUM 9.4 06/02/2018 0952   GFRNONAA 73 06/02/2018 0952   GFRAA 84 06/02/2018 0952   Lab Results  Component Value Date   HGBA1C 5.1 06/02/2018   HGBA1C 5.1 02/08/2018   Lab Results  Component Value Date   INSULIN 24.1 06/02/2018   INSULIN 18.8 02/08/2018   CBC    Component Value Date/Time   WBC 5.5 02/08/2018 0943   RBC 4.71 02/08/2018 0943   HGB 15.6 02/08/2018 0943   HCT 46.3 02/08/2018 0943   MCV 98 (H) 02/08/2018 0943   MCH 33.1 (H) 02/08/2018 0943   MCHC 33.7 02/08/2018 0943   RDW 12.7 02/08/2018 0943   LYMPHSABS 1.8 02/08/2018 0943   EOSABS 0.1 02/08/2018 0943   BASOSABS 0.0 02/08/2018 0943   Iron/TIBC/Ferritin/ %Sat No results found for: IRON, TIBC, FERRITIN, IRONPCTSAT Lipid Panel     Component Value Date/Time   CHOL 135 06/02/2018 0952   TRIG 192 (H) 06/02/2018 0952   HDL 34 (L) 06/02/2018 0952   LDLCALC 63 06/02/2018 0952   Hepatic Function Panel     Component Value Date/Time   PROT 6.9 06/02/2018 0952   ALBUMIN 4.5 06/02/2018 0952   AST 26 06/02/2018 0952   ALT 37 06/02/2018 0952   ALKPHOS 53 06/02/2018 0952   BILITOT <0.2 06/02/2018 0952      Component Value Date/Time   TSH 1.740 02/08/2018 0943    Ref. Range 06/02/2018 09:52  Vitamin D, 25-Hydroxy Latest Ref Range: 30.0 - 100.0 ng/mL 58.1    ASSESSMENT AND PLAN: Vitamin D deficiency - Plan: Vitamin D, Ergocalciferol, (DRISDOL) 50000 units CAPS capsule  Mixed hyperlipidemia  At risk  for heart disease  Class 1 obesity with serious comorbidity and body mass index (BMI) of 30.0 to 30.9 in adult, unspecified obesity type  PLAN:  Vitamin D Deficiency Darrell Mckinney was informed that low vitamin D levels contributes to fatigue and are associated with obesity, breast, and colon cancer. He agrees to continue to take prescription Vit D @50 ,000 IU every week and will follow up for routine testing of vitamin D, at least 2-3 times per year. He was informed of the risk of  over-replacement of vitamin D and agrees to not increase his dose unless he discusses this with [name] first. He agrees to follow up with our clinic as directed.   Hyperlipidemia Darrell Mckinney was informed of the American Heart Association Guidelines emphasizing intensive lifestyle modifications as the first line treatment for hyperlipidemia. We discussed many lifestyle modifications today in depth, and Darrell Mckinney will continue to work on decreasing saturated fats such as fatty red meat, butter and many fried foods. He will also increase vegetables and lean protein in his diet and continue to work on exercise and weight loss efforts. We will recheck labs next month. He agrees to follow up with our clinic as directed.   Cardiovascular risk counselling Darrell Mckinney was given extended (15 minutes) coronary artery disease prevention counseling today. He is 52 y.o. male and has risk factors for heart disease including obesity. We discussed intensive lifestyle modifications today with an emphasis on specific weight loss instructions and strategies. Pt was also informed of the importance of increasing exercise and decreasing saturated fats to help prevent heart disease.   Obesity Darrell Mckinney is currently in the action stage of change. As such, his goal is to continue with weight loss efforts He has agreed to follow a lower carbohydrate, vegetable and lean protein rich diet plan Darrell Mckinney has been instructed to work up to a goal of 150 minutes of combined  cardio and strengthening exercise per week for weight loss and overall health benefits. We discussed the following Behavioral Modification Stratagies today: increasing lean protein intake, decreasing simple carbohydrates  and work on meal planning and easy cooking plans   Darrell Mckinney has agreed to follow up with our clinic in 4-6 weeks. He was informed of the importance of frequent follow up visits to maximize his success with intensive lifestyle modifications for his multiple health conditions.   OBESITY BEHAVIORAL INTERVENTION VISIT  Today's visit was # 8   Starting weight: 251 lb Starting date: 02/08/18 Today's weight : 231 lb Today's date: 08/17/2018 Total lbs lost to date: 20 lb    ASK: We discussed the diagnosis of obesity with Darrell Mckinney today and Darrell Mckinney agreed to give [name] permission to discuss obesity behavioral modification therapy today.  ASSESS: Darrell Mckinney has the diagnosis of obesity and his BMI today is 30.48 Darrell Mckinney is in the action stage of change   ADVISE: Darrell Mckinney was educated on the multiple health risks of obesity as well as the benefit of weight loss to improve his health. He was advised of the need for long term treatment and the importance of lifestyle modifications to improve his current health and to decrease his risk of future health problems.  AGREE: Multiple dietary modification options and treatment options were discussed and  Darrell Mckinney agreed to follow the recommendations documented in the above note.  ARRANGE: Darrell Mckinney was educated on the importance of frequent visits to treat obesity as outlined per CMS and USPSTF guidelines and agreed to schedule his next follow up appointment today.  I, Renee Ramus, am acting as transcriptionist for Dennard Nip, MD   I have reviewed the above documentation for accuracy and completeness, and I agree with the above. -Dennard Nip, MD

## 2018-08-19 DIAGNOSIS — J3089 Other allergic rhinitis: Secondary | ICD-10-CM | POA: Diagnosis not present

## 2018-08-19 DIAGNOSIS — J301 Allergic rhinitis due to pollen: Secondary | ICD-10-CM | POA: Diagnosis not present

## 2018-08-19 DIAGNOSIS — J3081 Allergic rhinitis due to animal (cat) (dog) hair and dander: Secondary | ICD-10-CM | POA: Diagnosis not present

## 2018-08-23 DIAGNOSIS — J3081 Allergic rhinitis due to animal (cat) (dog) hair and dander: Secondary | ICD-10-CM | POA: Diagnosis not present

## 2018-08-23 DIAGNOSIS — J301 Allergic rhinitis due to pollen: Secondary | ICD-10-CM | POA: Diagnosis not present

## 2018-08-23 DIAGNOSIS — J3089 Other allergic rhinitis: Secondary | ICD-10-CM | POA: Diagnosis not present

## 2018-08-30 DIAGNOSIS — J3089 Other allergic rhinitis: Secondary | ICD-10-CM | POA: Diagnosis not present

## 2018-08-30 DIAGNOSIS — J301 Allergic rhinitis due to pollen: Secondary | ICD-10-CM | POA: Diagnosis not present

## 2018-08-30 DIAGNOSIS — J3081 Allergic rhinitis due to animal (cat) (dog) hair and dander: Secondary | ICD-10-CM | POA: Diagnosis not present

## 2018-09-06 DIAGNOSIS — J301 Allergic rhinitis due to pollen: Secondary | ICD-10-CM | POA: Diagnosis not present

## 2018-09-06 DIAGNOSIS — J3081 Allergic rhinitis due to animal (cat) (dog) hair and dander: Secondary | ICD-10-CM | POA: Diagnosis not present

## 2018-09-06 DIAGNOSIS — J3089 Other allergic rhinitis: Secondary | ICD-10-CM | POA: Diagnosis not present

## 2018-09-16 ENCOUNTER — Ambulatory Visit (INDEPENDENT_AMBULATORY_CARE_PROVIDER_SITE_OTHER): Payer: 59 | Admitting: Family Medicine

## 2018-09-21 ENCOUNTER — Ambulatory Visit (INDEPENDENT_AMBULATORY_CARE_PROVIDER_SITE_OTHER): Payer: 59 | Admitting: Family Medicine

## 2018-09-21 VITALS — BP 125/78 | HR 56 | Temp 97.9°F | Ht 73.0 in | Wt 235.0 lb

## 2018-09-21 DIAGNOSIS — E7849 Other hyperlipidemia: Secondary | ICD-10-CM | POA: Diagnosis not present

## 2018-09-21 DIAGNOSIS — J301 Allergic rhinitis due to pollen: Secondary | ICD-10-CM | POA: Diagnosis not present

## 2018-09-21 DIAGNOSIS — E559 Vitamin D deficiency, unspecified: Secondary | ICD-10-CM

## 2018-09-21 DIAGNOSIS — E669 Obesity, unspecified: Secondary | ICD-10-CM

## 2018-09-21 DIAGNOSIS — Z9189 Other specified personal risk factors, not elsewhere classified: Secondary | ICD-10-CM

## 2018-09-21 DIAGNOSIS — J3081 Allergic rhinitis due to animal (cat) (dog) hair and dander: Secondary | ICD-10-CM | POA: Diagnosis not present

## 2018-09-21 DIAGNOSIS — R748 Abnormal levels of other serum enzymes: Secondary | ICD-10-CM | POA: Diagnosis not present

## 2018-09-21 DIAGNOSIS — J3089 Other allergic rhinitis: Secondary | ICD-10-CM | POA: Diagnosis not present

## 2018-09-21 DIAGNOSIS — Z6831 Body mass index (BMI) 31.0-31.9, adult: Secondary | ICD-10-CM

## 2018-09-21 DIAGNOSIS — Z125 Encounter for screening for malignant neoplasm of prostate: Secondary | ICD-10-CM | POA: Diagnosis not present

## 2018-09-21 MED ORDER — VITAMIN D (ERGOCALCIFEROL) 1.25 MG (50000 UNIT) PO CAPS: 50000.0000 [IU] | ORAL_CAPSULE | ORAL | 0 refills | Status: DC

## 2018-09-21 NOTE — Progress Notes (Signed)
Office: [phone]  /  Fax: [phone]   HPI:   Chief Complaint: OBESITY Darrell Mckinney is here to discuss his progress with his obesity treatment plan. He is on the lower carbohydrate, vegetable and lean protein rich diet plan and is following his eating plan approximately 80 % of the time. He states he is weight lifting and doing cardio 2 times per week. The low carb diet is the only diet that works with [employer] as a [employer] under[employer]. He has been in training recently and off track with the plan.  His weight is 235 lb (106.6 kg) today and has had a weight gain of 4 pounds over a period of 4 weeks since his last visit. He has lost 16 lbs since starting treatment with [name].  Vitamin D deficiency Darrell Mckinney has a diagnosis of vitamin D deficiency. He is currently taking vit D and denies nausea, vomiting or muscle weakness.  Hyperlipidemia Darrell Mckinney has hyperlipidemia and has been trying to improve his cholesterol levels with intensive lifestyle modification including a low saturated fat diet, exercise and weight loss. He denies any chest pain or claudication.  At risk for cardiovascular disease Darrell Mckinney is at a higher than average risk for cardiovascular disease due to hyperlipidemia and obesity. He currently denies any chest pain.  ALLERGIES: No Known Allergies  MEDICATIONS: Current Outpatient Medications on File Prior to Visit  Medication Sig Dispense Refill  . B Complex Vitamins (VITAMIN B COMPLEX PO) Take 1 tablet by mouth daily.    Marland Kitchen ibuprofen (ADVIL,MOTRIN) 800 MG tablet Take 800 mg by mouth every 6 (six) hours as needed.     . montelukast (SINGULAIR) 10 MG tablet Take 10 mg by mouth daily.     . pantoprazole (PROTONIX) 40 MG tablet Take 40 mg by mouth daily.     No current facility-administered medications on file prior to visit.     PAST MEDICAL HISTORY: Past Medical History:  Diagnosis Date  . Acid reflux   . Back pain   . Bulging lumbar disc   . Fatigue   . GERD  (gastroesophageal reflux disease)   . Hay fever   . Joint pain   . Muscle pain   . Sciatic pain   . Seasonal allergies     PAST SURGICAL HISTORY: Past Surgical History:  Procedure Laterality Date  . BACK SURGERY  ~2006  . COLONOSCOPY  12/2008   RMR: normal  . COLONOSCOPY N/A 10/26/2015   Procedure: COLONOSCOPY;  Surgeon: Daneil Dolin, MD;  [facility];  Service: Endoscopy;  Laterality: N/A;  11:15 - moved to 12:00 - pt knows to arrive at 11:00  . TURBINATE RESECTION Bilateral 01/25/2013   Procedure: BILATERAL TURBINATE RESECTION;  Surgeon: Ascencion Dike, MD;  Location: [facility];  Service: ENT;  Laterality: Bilateral;    SOCIAL HISTORY: Social History   Tobacco Use  . Smoking status: Never Smoker  . Smokeless tobacco: Never Used  Substance Use Topics  . Alcohol use: Yes    Alcohol/week: 0.0 standard drinks    Comment: six pack per week  . Drug use: No    FAMILY HISTORY: Family History  Problem Relation Age of Onset  . Colon cancer Father        dx age 57, deceased age 51  . Colon cancer Paternal Uncle        age 81s  . Stroke Mother     ROS: Review of Systems  Constitutional: Negative for weight loss.  Cardiovascular: Negative for chest pain and claudication.  Gastrointestinal: Negative for nausea and vomiting.  Musculoskeletal:       Negative for muscle weakness.    PHYSICAL EXAM: Blood pressure 125/78, pulse (!) 56, temperature 97.9 F (36.6 C), temperature source Oral, height 6\' 1"  (1.854 m), weight 235 lb (106.6 kg), SpO2 98 %. Body mass index is 31 kg/m. Physical Exam  Constitutional: He is oriented to person, place, and time. He appears well-developed and well-nourished.  Cardiovascular: Normal rate.  Pulmonary/Chest: Effort normal.  Musculoskeletal: Normal range of motion.  Neurological: He is oriented to person, place, and time.  Skin: Skin is warm and dry.  Psychiatric: He has a normal mood and affect. His behavior is  normal.  Vitals reviewed.   RECENT LABS AND TESTS: BMET    Component Value Date/Time   NA 142 06/02/2018 0952   K 4.2 06/02/2018 0952   CL 103 06/02/2018 0952   CO2 20 06/02/2018 0952   GLUCOSE 99 06/02/2018 0952   BUN 17 06/02/2018 0952   CREATININE 1.16 06/02/2018 0952   CALCIUM 9.4 06/02/2018 0952   GFRNONAA 73 06/02/2018 0952   GFRAA 84 06/02/2018 0952   Lab Results  Component Value Date   HGBA1C 5.1 06/02/2018   HGBA1C 5.1 02/08/2018   Lab Results  Component Value Date   INSULIN 24.1 06/02/2018   INSULIN 18.8 02/08/2018   CBC    Component Value Date/Time   WBC 5.5 02/08/2018 0943   RBC 4.71 02/08/2018 0943   HGB 15.6 02/08/2018 0943   HCT 46.3 02/08/2018 0943   MCV 98 (H) 02/08/2018 0943   MCH 33.1 (H) 02/08/2018 0943   MCHC 33.7 02/08/2018 0943   RDW 12.7 02/08/2018 0943   LYMPHSABS 1.8 02/08/2018 0943   EOSABS 0.1 02/08/2018 0943   BASOSABS 0.0 02/08/2018 0943   Iron/TIBC/Ferritin/ %Sat No results found for: IRON, TIBC, FERRITIN, IRONPCTSAT Lipid Panel     Component Value Date/Time   CHOL 135 06/02/2018 0952   TRIG 192 (H) 06/02/2018 0952   HDL 34 (L) 06/02/2018 0952   LDLCALC 63 06/02/2018 0952   Hepatic Function Panel     Component Value Date/Time   PROT 6.9 06/02/2018 0952   ALBUMIN 4.5 06/02/2018 0952   AST 26 06/02/2018 0952   ALT 37 06/02/2018 0952   ALKPHOS 53 06/02/2018 0952   BILITOT <0.2 06/02/2018 0952      Component Value Date/Time   TSH 1.740 02/08/2018 0943   Results for KALIB, BHAGAT (MRN [id number]) as of 09/21/2018 17:57  Ref. Range 06/02/2018 09:52  Vitamin D, 25-Hydroxy Latest Ref Range: 30.0 - 100.0 ng/mL 58.1   ASSESSMENT AND PLAN: Vitamin D deficiency - Plan: Comprehensive metabolic panel, Hemoglobin A1c, Insulin, random, VITAMIN D 25 Hydroxy (Vit-D Deficiency, Fractures), Vitamin D, Ergocalciferol, (DRISDOL) 50000 units CAPS capsule  Other hyperlipidemia - Plan: Lipid Panel With LDL/HDL Ratio  At risk for  heart disease  Class 1 obesity with serious comorbidity and body mass index (BMI) of 31.0 to 31.9 in adult, unspecified obesity type  PLAN:  Vitamin D Deficiency Achillies was informed that low vitamin D levels contributes to fatigue and are associated with obesity, breast, and colon cancer. He agrees to continue to take prescription Vit D @50 ,000 IU every week #4 with no refills and will follow up for routine testing of vitamin D, at least 2-3 times per year. He was informed of the risk of over-replacement of vitamin D and agrees to not increase his  dose unless he discusses this with [name] first. Onyekachi agrees to follow up in 3 weeks.  Hyperlipidemia Hiroshi was informed of the American Heart Association Guidelines emphasizing intensive lifestyle modifications as the first line treatment for hyperlipidemia. We discussed many lifestyle modifications today in depth, and Maalik will continue to work on decreasing saturated fats such as fatty red meat, butter and many fried foods. He will also increase vegetables and lean protein in his diet and continue to work on exercise and weight loss efforts. He agrees to continue with his diet and will follow up in 3 weeks.  Cardiovascular risk counseling Nima was given extended (15 minutes) coronary artery disease prevention counseling today. He is 52 y.o. male and has risk factors for heart disease including hyperlipidemia and obesity. We discussed intensive lifestyle modifications today with an emphasis on specific weight loss instructions and strategies. Pt was also informed of the importance of increasing exercise and decreasing saturated fats to help prevent heart disease.  Obesity Chais is currently in the action stage of change. As such, his goal is to continue with weight loss efforts. He has agreed to follow a lower carbohydrate, vegetable and lean protein rich diet plan. Mayan has been instructed to work up to a goal of 150 minutes of combined  cardio and strengthening exercise per week for weight loss and overall health benefits. We discussed the following Behavioral Modification Strategies today: decreasing simple carbohydrates, increase H2O intake, travel eating strategies, and planning for success.   Bastion has agreed to follow up with our clinic in 3 weeks. He was informed of the importance of frequent follow up visits to maximize his success with intensive lifestyle modifications for his multiple health conditions.   OBESITY BEHAVIORAL INTERVENTION VISIT  Today's visit was # 9   Starting weight: 251 lbs   Starting date: 02/08/18 Today's weight : Weight: 235 lb (106.6 kg)  Today's date: 09/21/2018 Total lbs lost to date: 16  ASK: We discussed the diagnosis of obesity with [name] today and Legrand Como agreed to give [name] permission to discuss obesity behavioral modification therapy today.  ASSESS: Jery has the diagnosis of obesity and his BMI today is 31.01. Iverson is in the action stage of change.   ADVISE: Sheri was educated on the multiple health risks of obesity as well as the benefit of weight loss to improve his health. He was advised of the need for long term treatment and the importance of lifestyle modifications to improve his current health and to decrease his risk of future health problems.  AGREE: Multiple dietary modification options and treatment options were discussed and Arval agreed to follow the recommendations documented in the above note.  ARRANGE: Ellis was educated on the importance of frequent visits to treat obesity as outlined per CMS and USPSTF guidelines and agreed to schedule his next follow up appointment today.  I, Marcille Blanco, am acting as transcriptionist for Starlyn Skeans, MD  I have reviewed the above documentation for accuracy and completeness, and I agree with the above. -Dennard Nip, MD

## 2018-09-22 DIAGNOSIS — Z23 Encounter for immunization: Secondary | ICD-10-CM | POA: Diagnosis not present

## 2018-09-22 LAB — HEPATIC FUNCTION PANEL
ALT: 30 IU/L (ref 0–44)
AST: 24 IU/L (ref 0–40)
Albumin: 4.8 g/dL (ref 3.5–5.5)
Alkaline Phosphatase: 49 IU/L (ref 39–117)
Bilirubin Total: 0.5 mg/dL (ref 0.0–1.2)
Bilirubin, Direct: 0.15 mg/dL (ref 0.00–0.40)
Total Protein: 6.9 g/dL (ref 6.0–8.5)

## 2018-09-22 LAB — PSA: Prostate Specific Ag, Serum: 0.6 ng/mL (ref 0.0–4.0)

## 2018-09-28 DIAGNOSIS — J301 Allergic rhinitis due to pollen: Secondary | ICD-10-CM | POA: Diagnosis not present

## 2018-09-28 DIAGNOSIS — J3081 Allergic rhinitis due to animal (cat) (dog) hair and dander: Secondary | ICD-10-CM | POA: Diagnosis not present

## 2018-09-28 DIAGNOSIS — J3089 Other allergic rhinitis: Secondary | ICD-10-CM | POA: Diagnosis not present

## 2018-10-01 ENCOUNTER — Encounter (INDEPENDENT_AMBULATORY_CARE_PROVIDER_SITE_OTHER): Payer: Self-pay | Admitting: Family Medicine

## 2018-10-07 ENCOUNTER — Telehealth: Payer: Self-pay | Admitting: Family Medicine

## 2018-10-07 NOTE — Telephone Encounter (Signed)
Pt dropped off form to be completed and faxed to [phone]. Placed form in nurses box at nurse station.

## 2018-10-08 DIAGNOSIS — J301 Allergic rhinitis due to pollen: Secondary | ICD-10-CM | POA: Diagnosis not present

## 2018-10-08 DIAGNOSIS — J3089 Other allergic rhinitis: Secondary | ICD-10-CM | POA: Diagnosis not present

## 2018-10-08 DIAGNOSIS — J3081 Allergic rhinitis due to animal (cat) (dog) hair and dander: Secondary | ICD-10-CM | POA: Diagnosis not present

## 2018-10-10 ENCOUNTER — Other Ambulatory Visit (INDEPENDENT_AMBULATORY_CARE_PROVIDER_SITE_OTHER): Payer: Self-pay | Admitting: Family Medicine

## 2018-10-10 DIAGNOSIS — E559 Vitamin D deficiency, unspecified: Secondary | ICD-10-CM

## 2018-10-10 NOTE — Telephone Encounter (Signed)
My part of the form was filled in.  It does appear that it does require the bottom part to be filled out  Also the patient will need to sign the form as well

## 2018-10-11 NOTE — Telephone Encounter (Signed)
Patient notified and stated he is currently out of town for work and will have his wife pick up the form. Form up front for pick up

## 2018-10-11 NOTE — Telephone Encounter (Signed)
I filled out what was left, the pt will need to sign it also called and left a message for the pt to r/c so I can make him aware we have done our part,but can not fax as it is incomplete without his signature.( Form in the nurses folder ).

## 2018-10-12 ENCOUNTER — Ambulatory Visit (INDEPENDENT_AMBULATORY_CARE_PROVIDER_SITE_OTHER): Payer: 59 | Admitting: Family Medicine

## 2018-10-18 ENCOUNTER — Ambulatory Visit (INDEPENDENT_AMBULATORY_CARE_PROVIDER_SITE_OTHER): Payer: 59 | Admitting: Family Medicine

## 2018-10-18 ENCOUNTER — Encounter (INDEPENDENT_AMBULATORY_CARE_PROVIDER_SITE_OTHER): Payer: Self-pay | Admitting: Family Medicine

## 2018-10-18 VITALS — BP 125/74 | HR 50 | Temp 98.1°F | Ht 73.0 in | Wt 235.0 lb

## 2018-10-18 DIAGNOSIS — M9903 Segmental and somatic dysfunction of lumbar region: Secondary | ICD-10-CM | POA: Diagnosis not present

## 2018-10-18 DIAGNOSIS — J3081 Allergic rhinitis due to animal (cat) (dog) hair and dander: Secondary | ICD-10-CM | POA: Diagnosis not present

## 2018-10-18 DIAGNOSIS — M5442 Lumbago with sciatica, left side: Secondary | ICD-10-CM | POA: Diagnosis not present

## 2018-10-18 DIAGNOSIS — E7849 Other hyperlipidemia: Secondary | ICD-10-CM | POA: Diagnosis not present

## 2018-10-18 DIAGNOSIS — J301 Allergic rhinitis due to pollen: Secondary | ICD-10-CM | POA: Diagnosis not present

## 2018-10-18 DIAGNOSIS — E669 Obesity, unspecified: Secondary | ICD-10-CM

## 2018-10-18 DIAGNOSIS — E559 Vitamin D deficiency, unspecified: Secondary | ICD-10-CM

## 2018-10-18 DIAGNOSIS — Z6831 Body mass index (BMI) 31.0-31.9, adult: Secondary | ICD-10-CM | POA: Diagnosis not present

## 2018-10-18 DIAGNOSIS — J3089 Other allergic rhinitis: Secondary | ICD-10-CM | POA: Diagnosis not present

## 2018-10-18 DIAGNOSIS — M5137 Other intervertebral disc degeneration, lumbosacral region: Secondary | ICD-10-CM | POA: Diagnosis not present

## 2018-10-18 DIAGNOSIS — E66811 Obesity, class 1: Secondary | ICD-10-CM

## 2018-10-18 MED ORDER — VITAMIN D (ERGOCALCIFEROL) 1.25 MG (50000 UNIT) PO CAPS: 50000.0000 [IU] | ORAL_CAPSULE | ORAL | 0 refills | Status: DC

## 2018-10-19 ENCOUNTER — Other Ambulatory Visit (INDEPENDENT_AMBULATORY_CARE_PROVIDER_SITE_OTHER): Payer: Self-pay | Admitting: Family Medicine

## 2018-10-19 DIAGNOSIS — E7849 Other hyperlipidemia: Secondary | ICD-10-CM | POA: Diagnosis not present

## 2018-10-19 DIAGNOSIS — E559 Vitamin D deficiency, unspecified: Secondary | ICD-10-CM | POA: Diagnosis not present

## 2018-10-20 DIAGNOSIS — M9903 Segmental and somatic dysfunction of lumbar region: Secondary | ICD-10-CM | POA: Diagnosis not present

## 2018-10-20 DIAGNOSIS — M5442 Lumbago with sciatica, left side: Secondary | ICD-10-CM | POA: Diagnosis not present

## 2018-10-20 DIAGNOSIS — M5137 Other intervertebral disc degeneration, lumbosacral region: Secondary | ICD-10-CM | POA: Diagnosis not present

## 2018-10-20 LAB — COMPREHENSIVE METABOLIC PANEL
ALT: 32 IU/L (ref 0–44)
AST: 22 IU/L (ref 0–40)
Albumin/Globulin Ratio: 2.1 (ref 1.2–2.2)
Albumin: 4.9 g/dL (ref 3.5–5.5)
Alkaline Phosphatase: 56 IU/L (ref 39–117)
BUN/Creatinine Ratio: 12 (ref 9–20)
BUN: 14 mg/dL (ref 6–24)
Bilirubin Total: 0.7 mg/dL (ref 0.0–1.2)
CO2: 24 mmol/L (ref 20–29)
Calcium: 10 mg/dL (ref 8.7–10.2)
Chloride: 102 mmol/L (ref 96–106)
Creatinine, Ser: 1.16 mg/dL (ref 0.76–1.27)
GFR calc Af Amer: 84 mL/min/{1.73_m2} (ref >59)
GFR calc non Af Amer: 73 mL/min/{1.73_m2} (ref >59)
Globulin, Total: 2.3 g/dL (ref 1.5–4.5)
Glucose: 101 mg/dL — ABNORMAL HIGH (ref 65–99)
Potassium: 4.4 mmol/L (ref 3.5–5.2)
Sodium: 143 mmol/L (ref 134–144)
Total Protein: 7.2 g/dL (ref 6.0–8.5)

## 2018-10-20 LAB — LIPID PANEL WITH LDL/HDL RATIO
Cholesterol, Total: 120 mg/dL (ref 100–199)
HDL: 41 mg/dL (ref >39)
LDL Calculated: 52 mg/dL (ref 0–99)
LDl/HDL Ratio: 1.3 ratio (ref 0.0–3.6)
Triglycerides: 133 mg/dL (ref 0–149)
VLDL Cholesterol Cal: 27 mg/dL (ref 5–40)

## 2018-10-20 LAB — INSULIN, RANDOM: INSULIN: 17.3 u[IU]/mL (ref 2.6–24.9)

## 2018-10-20 LAB — HEMOGLOBIN A1C
Est. average glucose Bld gHb Est-mCnc: 103 mg/dL
Hgb A1c MFr Bld: 5.2 % (ref 4.8–5.6)

## 2018-10-20 LAB — VITAMIN D 25 HYDROXY (VIT D DEFICIENCY, FRACTURES): Vit D, 25-Hydroxy: 44.5 ng/mL (ref 30.0–100.0)

## 2018-10-25 ENCOUNTER — Encounter (INDEPENDENT_AMBULATORY_CARE_PROVIDER_SITE_OTHER): Payer: Self-pay | Admitting: Family Medicine

## 2018-10-25 DIAGNOSIS — J3081 Allergic rhinitis due to animal (cat) (dog) hair and dander: Secondary | ICD-10-CM | POA: Diagnosis not present

## 2018-10-25 DIAGNOSIS — J301 Allergic rhinitis due to pollen: Secondary | ICD-10-CM | POA: Diagnosis not present

## 2018-10-25 DIAGNOSIS — J3089 Other allergic rhinitis: Secondary | ICD-10-CM | POA: Diagnosis not present

## 2018-10-25 NOTE — Progress Notes (Signed)
Office: [phone]  /  Fax: [phone]   HPI:   Chief Complaint: OBESITY Darrell Mckinney is here to discuss his progress with his obesity treatment plan. He is on the lower carbohydrate, vegetable and lean protein rich diet plan and is following his eating plan approximately 75% of the time. He states he is doing cardio and weights 120 minutes 2 times per week. Darrell Mckinney has been in training in [place] which has him off track with his diet. He anticipates things getting back to normal after the first of the year. His weight is 235 lb (106.6 kg) today and he has maintained weight over a period of 4 weeks since his last visit. He has lost 16 lbs since starting treatment with [name].  Vitamin D deficiency Darrell Mckinney has a diagnosis of vitamin D deficiency. His last vitamin D level was at 58.1 on 06/02/18. He is currently taking vit D and denies nausea, vomiting or muscle weakness.  Hyperlipidemia Darrell Mckinney has hyperlipidemia and he is not on statin. Darrell Mckinney has been trying to improve his cholesterol levels with intensive lifestyle modification including a low saturated fat diet, exercise and weight loss. He denies any chest pain or shortness of breath.  ALLERGIES: No Known Allergies  MEDICATIONS: Current Outpatient Medications on File Prior to Visit  Medication Sig Dispense Refill  . B Complex Vitamins (VITAMIN B COMPLEX PO) Take 1 tablet by mouth daily.    Marland Kitchen ibuprofen (ADVIL,MOTRIN) 800 MG tablet Take 800 mg by mouth every 6 (six) hours as needed.     . montelukast (SINGULAIR) 10 MG tablet Take 10 mg by mouth daily.     . pantoprazole (PROTONIX) 40 MG tablet Take 40 mg by mouth daily.     No current facility-administered medications on file prior to visit.     PAST MEDICAL HISTORY: Past Medical History:  Diagnosis Date  . Acid reflux   . Back pain   . Bulging lumbar disc   . Fatigue   . GERD (gastroesophageal reflux disease)   . Hay fever   . Joint pain   . Muscle pain   . Sciatic pain     . Seasonal allergies     PAST SURGICAL HISTORY: Past Surgical History:  Procedure Laterality Date  . BACK SURGERY  ~2006  . COLONOSCOPY  12/2008   RMR: normal  . COLONOSCOPY N/A 10/26/2015   Procedure: COLONOSCOPY;  Surgeon: Daneil Dolin, MD;  [facility];  Service: Endoscopy;  Laterality: N/A;  11:15 - moved to 12:00 - pt knows to arrive at 11:00  . TURBINATE RESECTION Bilateral 01/25/2013   Procedure: BILATERAL TURBINATE RESECTION;  Surgeon: Ascencion Dike, MD;  Location: [facility];  Service: ENT;  Laterality: Bilateral;    SOCIAL HISTORY: Social History   Tobacco Use  . Smoking status: Never Smoker  . Smokeless tobacco: Never Used  Substance Use Topics  . Alcohol use: Yes    Alcohol/week: 0.0 standard drinks    Comment: six pack per week  . Drug use: No    FAMILY HISTORY: Family History  Problem Relation Age of Onset  . Colon cancer Father        dx age 62, deceased age 40  . Colon cancer Paternal Uncle        age 53s  . Stroke Mother     ROS: Review of Systems  Constitutional: Negative for weight loss.  Respiratory: Negative for shortness of breath.   Cardiovascular: Negative for chest pain.  Gastrointestinal: Negative for nausea and vomiting.  Musculoskeletal:       Negative for muscle weakness    PHYSICAL EXAM: Blood pressure 125/74, pulse (!) 50, temperature 98.1 F (36.7 C), temperature source Oral, height 6\' 1"  (1.854 m), weight 235 lb (106.6 kg), SpO2 99 %. Body mass index is 31 kg/m. Physical Exam  Constitutional: He is oriented to person, place, and time. He appears well-developed and well-nourished.  Cardiovascular: Normal rate.  Pulmonary/Chest: Effort normal.  Musculoskeletal: Normal range of motion.  Neurological: He is oriented to person, place, and time.  Skin: Skin is warm and dry.  Psychiatric: He has a normal mood and affect. His behavior is normal.  Vitals reviewed.   RECENT LABS AND TESTS: BMET     Component Value Date/Time   NA 143 10/19/2018 1103   K 4.4 10/19/2018 1103   CL 102 10/19/2018 1103   CO2 24 10/19/2018 1103   GLUCOSE 101 (H) 10/19/2018 1103   BUN 14 10/19/2018 1103   CREATININE 1.16 10/19/2018 1103   CALCIUM 10.0 10/19/2018 1103   GFRNONAA 73 10/19/2018 1103   GFRAA 84 10/19/2018 1103   Lab Results  Component Value Date   HGBA1C 5.2 10/19/2018   HGBA1C 5.1 06/02/2018   HGBA1C 5.1 02/08/2018   Lab Results  Component Value Date   INSULIN 17.3 10/19/2018   INSULIN 24.1 06/02/2018   INSULIN 18.8 02/08/2018   CBC    Component Value Date/Time   WBC 5.5 02/08/2018 0943   RBC 4.71 02/08/2018 0943   HGB 15.6 02/08/2018 0943   HCT 46.3 02/08/2018 0943   MCV 98 (H) 02/08/2018 0943   MCH 33.1 (H) 02/08/2018 0943   MCHC 33.7 02/08/2018 0943   RDW 12.7 02/08/2018 0943   LYMPHSABS 1.8 02/08/2018 0943   EOSABS 0.1 02/08/2018 0943   BASOSABS 0.0 02/08/2018 0943   Iron/TIBC/Ferritin/ %Sat No results found for: IRON, TIBC, FERRITIN, IRONPCTSAT Lipid Panel     Component Value Date/Time   CHOL 120 10/19/2018 1103   TRIG 133 10/19/2018 1103   HDL 41 10/19/2018 1103   LDLCALC 52 10/19/2018 1103   Hepatic Function Panel     Component Value Date/Time   PROT 7.2 10/19/2018 1103   ALBUMIN 4.9 10/19/2018 1103   AST 22 10/19/2018 1103   ALT 32 10/19/2018 1103   ALKPHOS 56 10/19/2018 1103   BILITOT 0.7 10/19/2018 1103   BILIDIR 0.15 09/21/2018 0812      Component Value Date/Time   TSH 1.740 02/08/2018 0943   Results for [name], [name] (MRN [id number]) as of 10/25/2018 12:03  Ref. Range 06/02/2018 09:52  Vitamin D, 25-Hydroxy Latest Ref Range: 30.0 - 100.0 ng/mL 58.1   ASSESSMENT AND PLAN: Vitamin D deficiency - Plan: Vitamin D, Ergocalciferol, (DRISDOL) 1.25 MG (50000 UT) CAPS capsule  Other hyperlipidemia  Class 1 obesity with serious comorbidity and body mass index (BMI) of 31.0 to 31.9 in adult, unspecified obesity type  PLAN:  Vitamin D  Deficiency Darrell Mckinney was informed that low vitamin D levels contributes to fatigue and are associated with obesity, breast, and colon cancer. He agrees to continue to take prescription Vit D @50 ,000 IU every week #4 with no refills and will follow up for routine testing of vitamin D, at least 2-3 times per year. He was informed of the risk of over-replacement of vitamin D and agrees to not increase his dose unless he discusses this with [name] first. Vitamin D level was drawn 09/21/18, but the results are  not available. We will investigate the reason for this.  Hyperlipidemia Darrell Mckinney was informed of the American Heart Association Guidelines emphasizing intensive lifestyle modifications as the first line treatment for hyperlipidemia. We discussed many lifestyle modifications today in depth, and Darrell Mckinney will continue to work on decreasing saturated fats such as fatty red meat, butter and many fried foods. He will also increase vegetables and lean protein in his diet and continue to work on exercise and weight loss efforts. His last lipid panel was drawn on 09/21/18, but the results are not available. We will investigate the reason for this.  Obesity Darrell Mckinney is currently in the action stage of change. As such, his goal is to continue with weight loss efforts He has agreed to follow a lower carbohydrate, vegetable and lean protein rich diet plan Darrell Mckinney will continue to do cardio and weights for 120 minutes 2 times per week for weight loss and overall health benefits. We discussed the following Behavioral Modification Strategies today: better snacking choices and holiday eating strategies  Thanksgiving handout was provided to patient today.  Darrell Mckinney has agreed to follow up with our clinic in 3 weeks. He was informed of the importance of frequent follow up visits to maximize his success with intensive lifestyle modifications for his multiple health conditions.   OBESITY BEHAVIORAL INTERVENTION VISIT  Today's  visit was # 10  Starting weight: 251 lbs Starting date: 02/08/2018 Today's weight : 235 lbs  Today's date: 10/18/2018 Total lbs lost to date: 16    ASK: We discussed the diagnosis of obesity with Darrell Mckinney today and Darrell Mckinney agreed to give [name] permission to discuss obesity behavioral modification therapy today.  ASSESS: Darrell Mckinney has the diagnosis of obesity and his BMI today is 31.01 Darrell Mckinney is in the action stage of change   ADVISE: Darrell Mckinney was educated on the multiple health risks of obesity as well as the benefit of weight loss to improve his health. He was advised of the need for long term treatment and the importance of lifestyle modifications to improve his current health and to decrease his risk of future health problems.  AGREE: Multiple dietary modification options and treatment options were discussed and  Kapena agreed to follow the recommendations documented in the above note.  ARRANGE: Darrell Mckinney was educated on the importance of frequent visits to treat obesity as outlined per CMS and USPSTF guidelines and agreed to schedule his next follow up appointment today.  Corey Skains, am acting as Location manager for [employer], FNP-C.  I have reviewed the above documentation for accuracy and completeness, and I agree with the above.  - Dawn Whitmire, FNP-C.

## 2018-10-29 DIAGNOSIS — M5442 Lumbago with sciatica, left side: Secondary | ICD-10-CM | POA: Diagnosis not present

## 2018-10-29 DIAGNOSIS — M9903 Segmental and somatic dysfunction of lumbar region: Secondary | ICD-10-CM | POA: Diagnosis not present

## 2018-10-29 DIAGNOSIS — M5137 Other intervertebral disc degeneration, lumbosacral region: Secondary | ICD-10-CM | POA: Diagnosis not present

## 2018-11-03 DIAGNOSIS — M9903 Segmental and somatic dysfunction of lumbar region: Secondary | ICD-10-CM | POA: Diagnosis not present

## 2018-11-03 DIAGNOSIS — J301 Allergic rhinitis due to pollen: Secondary | ICD-10-CM | POA: Diagnosis not present

## 2018-11-03 DIAGNOSIS — M5137 Other intervertebral disc degeneration, lumbosacral region: Secondary | ICD-10-CM | POA: Diagnosis not present

## 2018-11-03 DIAGNOSIS — J3089 Other allergic rhinitis: Secondary | ICD-10-CM | POA: Diagnosis not present

## 2018-11-03 DIAGNOSIS — M5442 Lumbago with sciatica, left side: Secondary | ICD-10-CM | POA: Diagnosis not present

## 2018-11-03 DIAGNOSIS — J3081 Allergic rhinitis due to animal (cat) (dog) hair and dander: Secondary | ICD-10-CM | POA: Diagnosis not present

## 2018-11-04 DIAGNOSIS — C441192 Basal cell carcinoma of skin of left lower eyelid, including canthus: Secondary | ICD-10-CM | POA: Diagnosis not present

## 2018-11-05 DIAGNOSIS — M5442 Lumbago with sciatica, left side: Secondary | ICD-10-CM | POA: Diagnosis not present

## 2018-11-05 DIAGNOSIS — M9903 Segmental and somatic dysfunction of lumbar region: Secondary | ICD-10-CM | POA: Diagnosis not present

## 2018-11-05 DIAGNOSIS — M5137 Other intervertebral disc degeneration, lumbosacral region: Secondary | ICD-10-CM | POA: Diagnosis not present

## 2018-11-08 ENCOUNTER — Ambulatory Visit (INDEPENDENT_AMBULATORY_CARE_PROVIDER_SITE_OTHER): Payer: 59 | Admitting: Family Medicine

## 2018-11-08 ENCOUNTER — Encounter (INDEPENDENT_AMBULATORY_CARE_PROVIDER_SITE_OTHER): Payer: Self-pay | Admitting: Family Medicine

## 2018-11-08 ENCOUNTER — Other Ambulatory Visit (INDEPENDENT_AMBULATORY_CARE_PROVIDER_SITE_OTHER): Payer: Self-pay | Admitting: Family Medicine

## 2018-11-08 VITALS — BP 111/70 | HR 55 | Temp 97.7°F | Ht 73.0 in | Wt 234.0 lb

## 2018-11-08 DIAGNOSIS — M5137 Other intervertebral disc degeneration, lumbosacral region: Secondary | ICD-10-CM | POA: Diagnosis not present

## 2018-11-08 DIAGNOSIS — E559 Vitamin D deficiency, unspecified: Secondary | ICD-10-CM

## 2018-11-08 DIAGNOSIS — Z9189 Other specified personal risk factors, not elsewhere classified: Secondary | ICD-10-CM | POA: Diagnosis not present

## 2018-11-08 DIAGNOSIS — M9903 Segmental and somatic dysfunction of lumbar region: Secondary | ICD-10-CM | POA: Diagnosis not present

## 2018-11-08 DIAGNOSIS — S01412S Laceration without foreign body of left cheek and temporomandibular area, sequela: Secondary | ICD-10-CM | POA: Diagnosis not present

## 2018-11-08 DIAGNOSIS — K5909 Other constipation: Secondary | ICD-10-CM

## 2018-11-08 DIAGNOSIS — Z6830 Body mass index (BMI) 30.0-30.9, adult: Secondary | ICD-10-CM

## 2018-11-08 DIAGNOSIS — M5442 Lumbago with sciatica, left side: Secondary | ICD-10-CM | POA: Diagnosis not present

## 2018-11-08 DIAGNOSIS — C441192 Basal cell carcinoma of skin of left lower eyelid, including canthus: Secondary | ICD-10-CM | POA: Diagnosis not present

## 2018-11-08 DIAGNOSIS — E669 Obesity, unspecified: Secondary | ICD-10-CM | POA: Diagnosis not present

## 2018-11-08 MED ORDER — VITAMIN D (ERGOCALCIFEROL) 1.25 MG (50000 UNIT) PO CAPS: 50000.0000 [IU] | ORAL_CAPSULE | ORAL | 0 refills | Status: DC

## 2018-11-09 ENCOUNTER — Telehealth (INDEPENDENT_AMBULATORY_CARE_PROVIDER_SITE_OTHER): Payer: Self-pay | Admitting: Family Medicine

## 2018-11-09 NOTE — Progress Notes (Signed)
Office: [phone]  /  Fax: [phone]   HPI:   Chief Complaint: OBESITY Darrell Mckinney is here to discuss his progress with his obesity treatment plan. He is on the lower carbohydrate, vegetable and lean protein rich diet plan and is following his eating plan approximately 75 % of the time. He states he is exercising 0 minutes 0 times per week. Darrell Mckinney has had a difficult time following a routine with eating over the last 3 months with increased traveling and eating out. He has done well avoiding weight gain and his hunger is controlled.  His weight is 234 lb (106.1 kg) today and has had a weight loss of 1 pound over a period of 3 weeks since his last visit. He has lost 17 lbs since starting treatment with [name].  Vitamin D deficiency Darrell Mckinney has a diagnosis of vitamin D deficiency. He is currently taking vit D and his vitamin D level has decreased since his last labs, but he is close to goal. He denies nausea, vomiting, or muscle weakness.  At risk for osteopenia and osteoporosis Darrell Mckinney is at higher risk of osteopenia and osteoporosis due to vitamin D deficiency.   Constipation Darrell Mckinney notes constipation for the last few weeks, worse since attempting weight loss. He has taken metamucil most days and feels it helps, but has noted his bowel movements are harder with following the low carb plan.   ASSESSMENT AND PLAN:  Vitamin D deficiency - Plan: Vitamin D, Ergocalciferol, (DRISDOL) 1.25 MG (50000 UT) CAPS capsule  Other constipation  At risk for osteoporosis  Class 1 obesity with serious comorbidity and body mass index (BMI) of 30.0 to 30.9 in adult, unspecified obesity type  PLAN:  Vitamin D Deficiency Darrell Mckinney was informed that low vitamin D levels contributes to fatigue and are associated with obesity, breast, and colon cancer. He agrees to continue to take prescription Vit D @50 ,000 IU every week #4 with no refills and will follow up for routine testing of vitamin D, at least 2-3  times per year. He was informed of the risk of over-replacement of vitamin D and agrees to not increase his dose unless he discusses this with [name] first. Darrell Mckinney agrees to follow up in 4 weeks.  At risk for osteopenia and osteoporosis Darrell Mckinney was given extended (15 minutes) osteoporosis prevention counseling today. Darrell Mckinney is at risk for osteopenia and osteoporosis due to his vitamin D deficiency. He was encouraged to take his vitamin D and follow his higher calcium diet and increase strengthening exercise to help strengthen his bones and decrease his risk of osteopenia and osteoporosis.  Constipation Darrell Mckinney was informed decrease bowel movement frequency is normal while losing weight, but stools should not be hard or painful. He was advised to increase his H20 intake and it is ok to add Benefiber. He agrees to work on increasing his fiber intake. High fiber foods were discussed today.  Obesity Darrell Mckinney is currently in the action stage of change. As such, his goal is to continue with weight loss efforts. He has agreed to follow a lower carbohydrate, vegetable and lean protein rich diet plan. Darrell Mckinney has been instructed to work up to a goal of 150 minutes of combined cardio and strengthening exercise per week for weight loss and overall health benefits. We discussed the following Behavioral Modification Strategies today: increasing lean protein intake, decreasing simple carbohydrates, increase H2O intake.   Darrell Mckinney has agreed to follow up with our clinic in 4 weeks. He was informed of the importance of  frequent follow up visits to maximize his success with intensive lifestyle modifications for his multiple health conditions.  ALLERGIES: No Known Allergies  MEDICATIONS: Current Outpatient Medications on File Prior to Visit  Medication Sig Dispense Refill  . B Complex Vitamins (VITAMIN B COMPLEX PO) Take 1 tablet by mouth daily.    Marland Kitchen ibuprofen (ADVIL,MOTRIN) 800 MG tablet Take 800 mg by mouth  every 6 (six) hours as needed.     . montelukast (SINGULAIR) 10 MG tablet Take 10 mg by mouth daily.     . pantoprazole (PROTONIX) 40 MG tablet Take 40 mg by mouth daily.     No current facility-administered medications on file prior to visit.     PAST MEDICAL HISTORY: Past Medical History:  Diagnosis Date  . Acid reflux   . Back pain   . Bulging lumbar disc   . Fatigue   . GERD (gastroesophageal reflux disease)   . Hay fever   . Joint pain   . Muscle pain   . Sciatic pain   . Seasonal allergies     PAST SURGICAL HISTORY: Past Surgical History:  Procedure Laterality Date  . BACK SURGERY  ~2006  . COLONOSCOPY  12/2008   RMR: normal  . COLONOSCOPY N/A 10/26/2015   Procedure: COLONOSCOPY;  Surgeon: Daneil Dolin, MD;  [facility];  Service: Endoscopy;  Laterality: N/A;  11:15 - moved to 12:00 - pt knows to arrive at 11:00  . TURBINATE RESECTION Bilateral 01/25/2013   Procedure: BILATERAL TURBINATE RESECTION;  Surgeon: Ascencion Dike, MD;  Location: [facility];  Service: ENT;  Laterality: Bilateral;    SOCIAL HISTORY: Social History   Tobacco Use  . Smoking status: Never Smoker  . Smokeless tobacco: Never Used  Substance Use Topics  . Alcohol use: Yes    Alcohol/week: 0.0 standard drinks    Comment: six pack per week  . Drug use: No    FAMILY HISTORY: Family History  Problem Relation Age of Onset  . Colon cancer Father        dx age 48, deceased age 23  . Colon cancer Paternal Uncle        age 62s  . Stroke Mother    ROS: Review of Systems  Constitutional: Positive for weight loss.  Gastrointestinal: Positive for constipation. Negative for nausea and vomiting.  Musculoskeletal:       Negative for muscle weakness.   PHYSICAL EXAM: Blood pressure 111/70, pulse (!) 55, temperature 97.7 F (36.5 C), temperature source Oral, height 6\' 1"  (1.854 m), weight 234 lb (106.1 kg), SpO2 99 %. Body mass index is 30.87 kg/m. Physical Exam Vitals  signs reviewed.  Constitutional:      Appearance: Normal appearance. He is obese.  Cardiovascular:     Rate and Rhythm: Normal rate.  Pulmonary:     Effort: Pulmonary effort is normal.  Musculoskeletal: Normal range of motion.  Skin:    General: Skin is warm and dry.  Neurological:     Mental Status: He is alert and oriented to person, place, and time.  Psychiatric:        Mood and Affect: Mood normal.        Behavior: Behavior normal.    RECENT LABS AND TESTS: BMET    Component Value Date/Time   NA 143 10/19/2018 1103   K 4.4 10/19/2018 1103   CL 102 10/19/2018 1103   CO2 24 10/19/2018 1103   GLUCOSE 101 (H) 10/19/2018 1103  BUN 14 10/19/2018 1103   CREATININE 1.16 10/19/2018 1103   CALCIUM 10.0 10/19/2018 1103   GFRNONAA 73 10/19/2018 1103   GFRAA 84 10/19/2018 1103   Lab Results  Component Value Date   HGBA1C 5.2 10/19/2018   HGBA1C 5.1 06/02/2018   HGBA1C 5.1 02/08/2018   Lab Results  Component Value Date   INSULIN 17.3 10/19/2018   INSULIN 24.1 06/02/2018   INSULIN 18.8 02/08/2018   CBC    Component Value Date/Time   WBC 5.5 02/08/2018 0943   RBC 4.71 02/08/2018 0943   HGB 15.6 02/08/2018 0943   HCT 46.3 02/08/2018 0943   MCV 98 (H) 02/08/2018 0943   MCH 33.1 (H) 02/08/2018 0943   MCHC 33.7 02/08/2018 0943   RDW 12.7 02/08/2018 0943   LYMPHSABS 1.8 02/08/2018 0943   EOSABS 0.1 02/08/2018 0943   BASOSABS 0.0 02/08/2018 0943   Iron/TIBC/Ferritin/ %Sat No results found for: IRON, TIBC, FERRITIN, IRONPCTSAT Lipid Panel     Component Value Date/Time   CHOL 120 10/19/2018 1103   TRIG 133 10/19/2018 1103   HDL 41 10/19/2018 1103   LDLCALC 52 10/19/2018 1103   Hepatic Function Panel     Component Value Date/Time   PROT 7.2 10/19/2018 1103   ALBUMIN 4.9 10/19/2018 1103   AST 22 10/19/2018 1103   ALT 32 10/19/2018 1103   ALKPHOS 56 10/19/2018 1103   BILITOT 0.7 10/19/2018 1103   BILIDIR 0.15 09/21/2018 0812      Component Value Date/Time    TSH 1.740 02/08/2018 0943   Results for HENDRICKS, SCHWANDT (MRN [id number]) as of 11/09/2018 06:45  Ref. Range 10/19/2018 11:03  Vitamin D, 25-Hydroxy Latest Ref Range: 30.0 - 100.0 ng/mL 44.5    OBESITY BEHAVIORAL INTERVENTION VISIT  Today's visit was # 11   Starting weight: 251 lbs Starting date: 02/08/18 Today's weight : Weight: 234 lb (106.1 kg)  Today's date: 11/08/2018 Total lbs lost to date: 17  ASK: We discussed the diagnosis of obesity with Darrell Mckinney today and Darrell Mckinney agreed to give [name] permission to discuss obesity behavioral modification therapy today.  ASSESS: Darrell Mckinney has the diagnosis of obesity and his BMI today is 30.8. Marques is in the action stage of change.   ADVISE: Kaedan was educated on the multiple health risks of obesity as well as the benefit of weight loss to improve his health. He was advised of the need for long term treatment and the importance of lifestyle modifications to improve his current health and to decrease his risk of future health problems.  AGREE: Multiple dietary modification options and treatment options were discussed and Dinh agreed to follow the recommendations documented in the above note.  ARRANGE: Raunak was educated on the importance of frequent visits to treat obesity as outlined per CMS and USPSTF guidelines and agreed to schedule his next follow up appointment today.  I, Marcille Blanco, am acting as transcriptionist for Starlyn Skeans, MD  I have reviewed the above documentation for accuracy and completeness, and I agree with the above. -Dennard Nip, MD

## 2018-11-09 NOTE — Telephone Encounter (Signed)
Patient called stated he received message from [facility] that the RX for Vitamin D needs to be resent. [facility]. Any questions patient may be reached at [phone].

## 2018-11-10 ENCOUNTER — Other Ambulatory Visit (INDEPENDENT_AMBULATORY_CARE_PROVIDER_SITE_OTHER): Payer: Self-pay

## 2018-11-10 DIAGNOSIS — M5442 Lumbago with sciatica, left side: Secondary | ICD-10-CM | POA: Diagnosis not present

## 2018-11-10 DIAGNOSIS — E559 Vitamin D deficiency, unspecified: Secondary | ICD-10-CM

## 2018-11-10 DIAGNOSIS — M5137 Other intervertebral disc degeneration, lumbosacral region: Secondary | ICD-10-CM | POA: Diagnosis not present

## 2018-11-10 DIAGNOSIS — M9903 Segmental and somatic dysfunction of lumbar region: Secondary | ICD-10-CM | POA: Diagnosis not present

## 2018-11-10 MED ORDER — VITAMIN D (ERGOCALCIFEROL) 1.25 MG (50000 UNIT) PO CAPS: 50000.0000 [IU] | ORAL_CAPSULE | ORAL | 0 refills | Status: DC

## 2018-11-10 NOTE — Telephone Encounter (Signed)
Prescription sent to the pharmacy. Darrell Mckinney, [name]

## 2018-11-11 DIAGNOSIS — J3081 Allergic rhinitis due to animal (cat) (dog) hair and dander: Secondary | ICD-10-CM | POA: Diagnosis not present

## 2018-11-11 DIAGNOSIS — J301 Allergic rhinitis due to pollen: Secondary | ICD-10-CM | POA: Diagnosis not present

## 2018-11-11 DIAGNOSIS — J3089 Other allergic rhinitis: Secondary | ICD-10-CM | POA: Diagnosis not present

## 2018-11-16 DIAGNOSIS — J3089 Other allergic rhinitis: Secondary | ICD-10-CM | POA: Diagnosis not present

## 2018-11-16 DIAGNOSIS — J301 Allergic rhinitis due to pollen: Secondary | ICD-10-CM | POA: Diagnosis not present

## 2018-11-16 DIAGNOSIS — J3081 Allergic rhinitis due to animal (cat) (dog) hair and dander: Secondary | ICD-10-CM | POA: Diagnosis not present

## 2018-11-26 DIAGNOSIS — J3081 Allergic rhinitis due to animal (cat) (dog) hair and dander: Secondary | ICD-10-CM | POA: Diagnosis not present

## 2018-11-26 DIAGNOSIS — J301 Allergic rhinitis due to pollen: Secondary | ICD-10-CM | POA: Diagnosis not present

## 2018-11-26 DIAGNOSIS — J3089 Other allergic rhinitis: Secondary | ICD-10-CM | POA: Diagnosis not present

## 2018-12-03 DIAGNOSIS — J3089 Other allergic rhinitis: Secondary | ICD-10-CM | POA: Diagnosis not present

## 2018-12-03 DIAGNOSIS — J301 Allergic rhinitis due to pollen: Secondary | ICD-10-CM | POA: Diagnosis not present

## 2018-12-03 DIAGNOSIS — J3081 Allergic rhinitis due to animal (cat) (dog) hair and dander: Secondary | ICD-10-CM | POA: Diagnosis not present

## 2018-12-06 ENCOUNTER — Other Ambulatory Visit (INDEPENDENT_AMBULATORY_CARE_PROVIDER_SITE_OTHER): Payer: Self-pay | Admitting: Family Medicine

## 2018-12-06 DIAGNOSIS — E559 Vitamin D deficiency, unspecified: Secondary | ICD-10-CM

## 2018-12-07 DIAGNOSIS — M9903 Segmental and somatic dysfunction of lumbar region: Secondary | ICD-10-CM | POA: Diagnosis not present

## 2018-12-07 DIAGNOSIS — M5442 Lumbago with sciatica, left side: Secondary | ICD-10-CM | POA: Diagnosis not present

## 2018-12-07 DIAGNOSIS — M5137 Other intervertebral disc degeneration, lumbosacral region: Secondary | ICD-10-CM | POA: Diagnosis not present

## 2018-12-08 ENCOUNTER — Encounter (INDEPENDENT_AMBULATORY_CARE_PROVIDER_SITE_OTHER): Payer: Self-pay | Admitting: Family Medicine

## 2018-12-08 ENCOUNTER — Ambulatory Visit (INDEPENDENT_AMBULATORY_CARE_PROVIDER_SITE_OTHER): Payer: 59 | Admitting: Family Medicine

## 2018-12-08 VITALS — BP 118/70 | HR 64 | Temp 97.9°F | Ht 73.0 in | Wt 235.0 lb

## 2018-12-08 DIAGNOSIS — E559 Vitamin D deficiency, unspecified: Secondary | ICD-10-CM

## 2018-12-08 DIAGNOSIS — Z9189 Other specified personal risk factors, not elsewhere classified: Secondary | ICD-10-CM | POA: Diagnosis not present

## 2018-12-08 DIAGNOSIS — E8881 Metabolic syndrome: Secondary | ICD-10-CM | POA: Diagnosis not present

## 2018-12-08 DIAGNOSIS — Z6831 Body mass index (BMI) 31.0-31.9, adult: Secondary | ICD-10-CM

## 2018-12-08 DIAGNOSIS — E669 Obesity, unspecified: Secondary | ICD-10-CM

## 2018-12-08 MED ORDER — VITAMIN D (ERGOCALCIFEROL) 1.25 MG (50000 UNIT) PO CAPS: 50000.0000 [IU] | ORAL_CAPSULE | ORAL | 0 refills | Status: DC

## 2018-12-09 ENCOUNTER — Ambulatory Visit (INDEPENDENT_AMBULATORY_CARE_PROVIDER_SITE_OTHER): Payer: 59 | Admitting: Family Medicine

## 2018-12-09 ENCOUNTER — Encounter: Payer: Self-pay | Admitting: Family Medicine

## 2018-12-09 VITALS — BP 118/72 | Temp 98.2°F | Ht 73.0 in | Wt 241.6 lb

## 2018-12-09 DIAGNOSIS — B9689 Other specified bacterial agents as the cause of diseases classified elsewhere: Secondary | ICD-10-CM | POA: Diagnosis not present

## 2018-12-09 DIAGNOSIS — J019 Acute sinusitis, unspecified: Secondary | ICD-10-CM

## 2018-12-09 DIAGNOSIS — Z85828 Personal history of other malignant neoplasm of skin: Secondary | ICD-10-CM | POA: Diagnosis not present

## 2018-12-09 DIAGNOSIS — H1033 Unspecified acute conjunctivitis, bilateral: Secondary | ICD-10-CM | POA: Diagnosis not present

## 2018-12-09 HISTORY — DX: Personal history of other malignant neoplasm of skin: Z85.828

## 2018-12-09 MED ORDER — AMOXICILLIN-POT CLAVULANATE 875-125 MG PO TABS: 1.0000 | ORAL_TABLET | Freq: Two times a day (BID) | ORAL | 0 refills | Status: DC

## 2018-12-09 MED ORDER — SULFACETAMIDE SODIUM 10 % OP SOLN: 2.0000 [drp] | Freq: Four times a day (QID) | OPHTHALMIC | 0 refills | Status: DC

## 2018-12-09 NOTE — Progress Notes (Signed)
   Subjective:    Patient ID: Darrell Mckinney, male    DOB: 10-26-1966, 53 y.o.   MRN: [id number]  Sinus Problem  This is a new problem. The current episode started yesterday. Associated symptoms include congestion, coughing and a sore throat. Pertinent negatives include no chills or ear pain. Past treatments include nothing.  Significant head congestion sinus pressure not feeling good as well as bilateral eye redness crusting.  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  HENT: Positive for congestion, rhinorrhea and sore throat. Negative for ear pain.   Eyes: Negative for discharge.  Respiratory: Positive for cough. Negative for wheezing.   Cardiovascular: Negative for chest pain.  Gastrointestinal: Negative for nausea and vomiting.  Musculoskeletal: Negative for arthralgias.       Objective:   Physical Exam Vitals signs and nursing note reviewed.  Constitutional:      Appearance: He is well-developed.  HENT:     Head: Normocephalic.     Mouth/Throat:     Pharynx: No oropharyngeal exudate.  Neck:     Musculoskeletal: Normal range of motion.  Cardiovascular:     Rate and Rhythm: Normal rate and regular rhythm.     Heart sounds: Normal heart sounds. No murmur.  Pulmonary:     Effort: Pulmonary effort is normal.     Breath sounds: Normal breath sounds. No wheezing.  Lymphadenopathy:     Cervical: No cervical adenopathy.  Skin:    General: Skin is warm and dry.  Neurological:     Motor: No abnormal muscle tone.   Bilateral conjunctivitis is noted No cellulitis where the basal cell cancer was removed Moderate sinus tenderness        Assessment & Plan:  Viral syndrome will take several days to get better Bilateral conjunctivitis eyedrops recommended for secondary  infection for the next 3 to 5 days Secondary rhinosinusitis Augmentin twice daily for the next 10 days If progressive troubles or worse follow-up Minimize sun exposure due to history of basal cell cancer

## 2018-12-12 NOTE — Progress Notes (Signed)
Office: [phone]  /  Fax: [phone]   HPI:   Chief Complaint: OBESITY Darrell Mckinney is here to discuss his progress with his obesity treatment plan. He is on the lower carbohydrate, vegetable and lean protein rich diet plan and is following his eating plan approximately 60 % of the time. He states he is doing cardio and weights for 120 minutes 3-4 times per week. Darrell Mckinney did well minimizing weight gain over the holidays. He noted a lot of increased temptations and tried to portion control but states he is ready to get back on track.  His weight is 235 lb (106.6 kg) today and has gained 1 pound since his last visit. He has lost 10 lbs since starting treatment with [name].  Vitamin D Deficiency Darrell Mckinney has a diagnosis of vitamin D deficiency. He is currently taking prescription Vit D and level is almost at goal. He denies nausea, vomiting or muscle weakness.  Insulin Resistance Darrell Mckinney has a diagnosis of insulin resistance based on his elevated fasting insulin level >5. Although Chane's blood glucose readings are still under good control, insulin resistance puts him at greater risk of metabolic syndrome and diabetes. He is not taking metformin currently and he is working on diet and exercise. He has questions about his changes of this developing into diabetes mellitus.   At risk for cardiovascular disease Darrell Mckinney is at a higher than average risk for cardiovascular disease due to obesity. He currently denies any chest pain.  ASSESSMENT AND PLAN:  Vitamin D deficiency - Plan: Vitamin D, Ergocalciferol, (DRISDOL) 1.25 MG (50000 UT) CAPS capsule  Insulin resistance  At risk for heart disease  Class 1 obesity with serious comorbidity and body mass index (BMI) of 31.0 to 31.9 in adult, unspecified obesity type  PLAN:  Vitamin D Deficiency Darrell Mckinney was informed that low vitamin D levels contributes to fatigue and are associated with obesity, breast, and colon cancer. Darrell Mckinney agrees to  continue taking prescription Vit D @50 ,000 IU every week #4 and we will refill for 1 month. He will follow up for routine testing of vitamin D, at least 2-3 times per year. He was informed of the risk of over-replacement of vitamin D and agrees to not increase his dose unless he discusses this with [name] first. We will recheck labs in 6 weeks. Darrell Mckinney agrees to follow up with our clinic in 4 weeks.  Insulin Resistance Darrell Mckinney will continue to work on weight loss, exercise, and decreasing simple carbohydrates in his diet to help decrease the risk of diabetes. We dicussed metformin including benefits and risks. He was informed that eating too many simple carbohydrates or too many calories at one sitting increases the likelihood of GI side effects. Darrell Mckinney declined metformin for now and prescription was not written today. We reviewed insulin resistance and how his lifestyle can affect his chances of developing diabetes mellitus. He was reassured he is doing well and we will continue to monitor his progress. Darrell Mckinney agrees to follow up with our clinic in 4 weeks as directed to monitor his progress.  Cardiovascular risk counselling Darrell Mckinney was given extended (15 minutes) coronary artery disease prevention counseling today. He is 53 y.o. male and has risk factors for heart disease including obesity. We discussed intensive lifestyle modifications today with an emphasis on specific weight loss instructions and strategies. Pt was also informed of the importance of increasing exercise and decreasing saturated fats to help prevent heart disease.  Obesity Darrell Mckinney is currently in the action stage of change. As  such, his goal is to continue with weight loss efforts He has agreed to follow a lower carbohydrate, vegetable and lean protein rich diet plan Darrell Mckinney has been instructed to work up to a goal of 150 minutes of combined cardio and strengthening exercise per week for weight loss and overall health benefits. We  discussed the following Behavioral Modification Strategies today: increasing lean protein intake, decrease eating out, and no skipping meals   Darrell Mckinney has agreed to follow up with our clinic in 4 weeks. He was informed of the importance of frequent follow up visits to maximize his success with intensive lifestyle modifications for his multiple health conditions.  ALLERGIES: No Known Allergies  MEDICATIONS: Current Outpatient Medications on File Prior to Visit  Medication Sig Dispense Refill  . B Complex Vitamins (VITAMIN B COMPLEX PO) Take 1 tablet by mouth daily.    Darrell Mckinney ibuprofen (ADVIL,MOTRIN) 800 MG tablet Take 800 mg by mouth every 6 (six) hours as needed.     . montelukast (SINGULAIR) 10 MG tablet Take 10 mg by mouth daily.     . pantoprazole (PROTONIX) 40 MG tablet Take 40 mg by mouth daily.     No current facility-administered medications on file prior to visit.     PAST MEDICAL HISTORY: Past Medical History:  Diagnosis Date  . Acid reflux   . Back pain   . Bulging lumbar disc   . Fatigue   . GERD (gastroesophageal reflux disease)   . Hay fever   . History of basal cell cancer 12/09/2018   Removed December 2020 left inner eye skin fold  . Joint pain   . Muscle pain   . Sciatic pain   . Seasonal allergies     PAST SURGICAL HISTORY: Past Surgical History:  Procedure Laterality Date  . BACK SURGERY  ~2006  . COLONOSCOPY  12/2008   RMR: normal  . COLONOSCOPY N/A 10/26/2015   Procedure: COLONOSCOPY;  Surgeon: Daneil Dolin, MD;  Location: [facility];  Service: Endoscopy;  Laterality: N/A;  11:15 - moved to 12:00 - pt knows to arrive at 11:00  . TURBINATE RESECTION Bilateral 01/25/2013   Procedure: BILATERAL TURBINATE RESECTION;  Surgeon: Ascencion Dike, MD;  Location: [facility];  Service: ENT;  Laterality: Bilateral;    SOCIAL HISTORY: Social History   Tobacco Use  . Smoking status: Never Smoker  . Smokeless tobacco: Never Used  Substance Use Topics    . Alcohol use: Yes    Alcohol/week: 0.0 standard drinks    Comment: six pack per week  . Drug use: No    FAMILY HISTORY: Family History  Problem Relation Age of Onset  . Colon cancer Father        dx age 57, deceased age 27  . Colon cancer Paternal Uncle        age 23s  . Stroke Mother     ROS: Review of Systems  Constitutional: Negative for weight loss.  Cardiovascular: Negative for chest pain.  Gastrointestinal: Negative for nausea and vomiting.  Musculoskeletal:       Negative muscle weakness    PHYSICAL EXAM: Blood pressure 118/70, pulse 64, temperature 97.9 F (36.6 C), temperature source Oral, height 6\' 1"  (1.854 m), weight 235 lb (106.6 kg), SpO2 98 %. Body mass index is 31 kg/m. Physical Exam Vitals signs reviewed.  Constitutional:      Appearance: Normal appearance. He is obese.  Cardiovascular:     Rate and Rhythm: Normal rate.  Pulses: Normal pulses.  Pulmonary:     Effort: Pulmonary effort is normal.     Breath sounds: Normal breath sounds.  Musculoskeletal: Normal range of motion.  Skin:    General: Skin is warm and dry.  Neurological:     Mental Status: He is alert and oriented to person, place, and time.  Psychiatric:        Mood and Affect: Mood normal.        Behavior: Behavior normal.     RECENT LABS AND TESTS: BMET    Component Value Date/Time   NA 143 10/19/2018 1103   K 4.4 10/19/2018 1103   CL 102 10/19/2018 1103   CO2 24 10/19/2018 1103   GLUCOSE 101 (H) 10/19/2018 1103   BUN 14 10/19/2018 1103   CREATININE 1.16 10/19/2018 1103   CALCIUM 10.0 10/19/2018 1103   GFRNONAA 73 10/19/2018 1103   GFRAA 84 10/19/2018 1103   Lab Results  Component Value Date   HGBA1C 5.2 10/19/2018   HGBA1C 5.1 06/02/2018   HGBA1C 5.1 02/08/2018   Lab Results  Component Value Date   INSULIN 17.3 10/19/2018   INSULIN 24.1 06/02/2018   INSULIN 18.8 02/08/2018   CBC    Component Value Date/Time   WBC 5.5 02/08/2018 0943   RBC 4.71  02/08/2018 0943   HGB 15.6 02/08/2018 0943   HCT 46.3 02/08/2018 0943   MCV 98 (H) 02/08/2018 0943   MCH 33.1 (H) 02/08/2018 0943   MCHC 33.7 02/08/2018 0943   RDW 12.7 02/08/2018 0943   LYMPHSABS 1.8 02/08/2018 0943   EOSABS 0.1 02/08/2018 0943   BASOSABS 0.0 02/08/2018 0943   Iron/TIBC/Ferritin/ %Sat No results found for: IRON, TIBC, FERRITIN, IRONPCTSAT Lipid Panel     Component Value Date/Time   CHOL 120 10/19/2018 1103   TRIG 133 10/19/2018 1103   HDL 41 10/19/2018 1103   LDLCALC 52 10/19/2018 1103   Hepatic Function Panel     Component Value Date/Time   PROT 7.2 10/19/2018 1103   ALBUMIN 4.9 10/19/2018 1103   AST 22 10/19/2018 1103   ALT 32 10/19/2018 1103   ALKPHOS 56 10/19/2018 1103   BILITOT 0.7 10/19/2018 1103   BILIDIR 0.15 09/21/2018 0812      Component Value Date/Time   TSH 1.740 02/08/2018 0943      OBESITY BEHAVIORAL INTERVENTION VISIT  Today's visit was # 12   Starting weight: 251 lbs Starting date: 02/08/18 Today's weight : 235 lbs  Today's date: 12/08/2018 Total lbs lost to date: 10    ASK: We discussed the diagnosis of obesity with [name] today and Legrand Como agreed to give [name] permission to discuss obesity behavioral modification therapy today.  ASSESS: Lanorris has the diagnosis of obesity and his BMI today is 31.88 Jerardo is in the action stage of change   ADVISE: Verdun was educated on the multiple health risks of obesity as well as the benefit of weight loss to improve his health. He was advised of the need for long term treatment and the importance of lifestyle modifications to improve his current health and to decrease his risk of future health problems.  AGREE: Multiple dietary modification options and treatment options were discussed and  Quashon agreed to follow the recommendations documented in the above note.  ARRANGE: Raydell was educated on the importance of frequent visits to treat obesity as outlined per CMS and  USPSTF guidelines and agreed to schedule his next follow up appointment today.  Wilhemena Durie,  am acting as transcriptionist for Dennard Nip, MD  I have reviewed the above documentation for accuracy and completeness, and I agree with the above. -Dennard Nip, MD

## 2018-12-14 ENCOUNTER — Encounter: Payer: Self-pay | Admitting: Family Medicine

## 2018-12-14 ENCOUNTER — Ambulatory Visit: Payer: Self-pay | Admitting: Family Medicine

## 2018-12-14 VITALS — BP 123/79 | HR 69 | Ht 73.0 in | Wt 242.6 lb

## 2018-12-14 DIAGNOSIS — Z0289 Encounter for other administrative examinations: Secondary | ICD-10-CM

## 2018-12-14 DIAGNOSIS — Z029 Encounter for administrative examinations, unspecified: Secondary | ICD-10-CM

## 2018-12-14 NOTE — Progress Notes (Signed)
BP 123/79   Pulse 69   Ht 6\' 1"  (1.854 m)   Wt 242 lb 9.6 oz (110 kg)   BMI 32.01 kg/m    Subjective:    Patient ID: Darrell Mckinney, male    DOB: [birth year], 53 y.o.   MRN: [id number]  HPI: Darrell Mckinney is a 53 y.o. male  Chief Complaint  Patient presents with  . Flight Physical    class 1   Patient all in all doing well no complaints  Relevant past medical, surgical, family and social history reviewed and updated as indicated. Interim medical history since our last visit reviewed. Allergies and medications reviewed and updated.  Review of Systems  Constitutional: Negative.   HENT: Negative.   Eyes: Negative.   Respiratory: Negative.   Cardiovascular: Negative.   Gastrointestinal: Negative.   Endocrine: Negative.   Genitourinary: Negative.   Musculoskeletal: Negative.   Skin: Negative.   Allergic/Immunologic: Negative.   Neurological: Negative.   Hematological: Negative.   Psychiatric/Behavioral: Negative.     Per HPI unless specifically indicated above     Objective:    BP 123/79   Pulse 69   Ht 6\' 1"  (1.854 m)   Wt 242 lb 9.6 oz (110 kg)   BMI 32.01 kg/m   Wt Readings from Last 3 Encounters:  12/14/18 242 lb 9.6 oz (110 kg)  12/09/18 241 lb 9.6 oz (109.6 kg)  12/08/18 235 lb (106.6 kg)    Physical Exam Constitutional:      Appearance: He is well-developed.  HENT:     Head: Normocephalic.     Right Ear: External ear normal.     Left Ear: External ear normal.     Nose: Nose normal.  Eyes:     Conjunctiva/sclera: Conjunctivae normal.     Pupils: Pupils are equal, round, and reactive to light.  Neck:     Musculoskeletal: Normal range of motion and neck supple.     Thyroid: No thyromegaly.  Cardiovascular:     Rate and Rhythm: Normal rate and regular rhythm.     Heart sounds: Normal heart sounds.  Pulmonary:     Effort: Pulmonary effort is normal.     Breath sounds: Normal breath sounds.  Abdominal:     General: Bowel sounds are normal.       Palpations: Abdomen is soft. There is no hepatomegaly or splenomegaly.  Genitourinary:    Penis: Normal.   Musculoskeletal: Normal range of motion.  Lymphadenopathy:     Cervical: No cervical adenopathy.  Skin:    General: Skin is warm and dry.  Neurological:     Mental Status: He is alert and oriented to person, place, and time.     Deep Tendon Reflexes: Reflexes are normal and symmetric.  Psychiatric:        Behavior: Behavior normal.        Thought Content: Thought content normal.        Judgment: Judgment normal.     Results for orders placed or performed in visit on 10/19/18  Comprehensive metabolic panel  Result Value Ref Range   Glucose 101 (H) 65 - 99 mg/dL   BUN 14 6 - 24 mg/dL   Creatinine, Ser 1.16 0.76 - 1.27 mg/dL   GFR calc non Af Amer 73 >59 mL/min/1.73   GFR calc Af Amer 84 >59 mL/min/1.73   BUN/Creatinine Ratio 12 9 - 20   Sodium 143 134 - 144 mmol/L   Potassium 4.4 3.5 -  5.2 mmol/L   Chloride 102 96 - 106 mmol/L   CO2 24 20 - 29 mmol/L   Calcium 10.0 8.7 - 10.2 mg/dL   Total Protein 7.2 6.0 - 8.5 g/dL   Albumin 4.9 3.5 - 5.5 g/dL   Globulin, Total 2.3 1.5 - 4.5 g/dL   Albumin/Globulin Ratio 2.1 1.2 - 2.2   Bilirubin Total 0.7 0.0 - 1.2 mg/dL   Alkaline Phosphatase 56 39 - 117 IU/L   AST 22 0 - 40 IU/L   ALT 32 0 - 44 IU/L  Lipid Panel With LDL/HDL Ratio  Result Value Ref Range   Cholesterol, Total 120 100 - 199 mg/dL   Triglycerides 133 0 - 149 mg/dL   HDL 41 >39 mg/dL   VLDL Cholesterol Cal 27 5 - 40 mg/dL   LDL Calculated 52 0 - 99 mg/dL   LDl/HDL Ratio 1.3 0.0 - 3.6 ratio  Hemoglobin A1c  Result Value Ref Range   Hgb A1c MFr Bld 5.2 4.8 - 5.6 %   Est. 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

## 2018-12-15 ENCOUNTER — Telehealth: Payer: Self-pay | Admitting: Family Medicine

## 2018-12-15 DIAGNOSIS — M5137 Other intervertebral disc degeneration, lumbosacral region: Secondary | ICD-10-CM | POA: Diagnosis not present

## 2018-12-15 DIAGNOSIS — M9903 Segmental and somatic dysfunction of lumbar region: Secondary | ICD-10-CM | POA: Diagnosis not present

## 2018-12-15 DIAGNOSIS — M5442 Lumbago with sciatica, left side: Secondary | ICD-10-CM | POA: Diagnosis not present

## 2018-12-15 DIAGNOSIS — J301 Allergic rhinitis due to pollen: Secondary | ICD-10-CM | POA: Diagnosis not present

## 2018-12-15 DIAGNOSIS — J3089 Other allergic rhinitis: Secondary | ICD-10-CM | POA: Diagnosis not present

## 2018-12-15 DIAGNOSIS — J3081 Allergic rhinitis due to animal (cat) (dog) hair and dander: Secondary | ICD-10-CM | POA: Diagnosis not present

## 2018-12-15 NOTE — Telephone Encounter (Signed)
Pt was seen last week for pink eye. He is wondering how long he should be using them eye drops. His eye has cleared up but he is still having some blurry vision.   Also he would like to know when his last colonoscopy was done.   CB# [phone].

## 2018-12-15 NOTE — Telephone Encounter (Signed)
Let pt know that his last colonoscopy was dec 2016 and he was due again in dec 2021.   Pt states he is still taking amoxil and using eye drops that were given last week. Eye is better. Redness is gone. Vision is still blurry and wonders if this is normal. Swelling and numbness is much better.

## 2018-12-16 DIAGNOSIS — J3089 Other allergic rhinitis: Secondary | ICD-10-CM | POA: Diagnosis not present

## 2018-12-16 DIAGNOSIS — J3081 Allergic rhinitis due to animal (cat) (dog) hair and dander: Secondary | ICD-10-CM | POA: Diagnosis not present

## 2018-12-17 DIAGNOSIS — M5137 Other intervertebral disc degeneration, lumbosacral region: Secondary | ICD-10-CM | POA: Diagnosis not present

## 2018-12-17 DIAGNOSIS — M5442 Lumbago with sciatica, left side: Secondary | ICD-10-CM | POA: Diagnosis not present

## 2018-12-17 DIAGNOSIS — M9903 Segmental and somatic dysfunction of lumbar region: Secondary | ICD-10-CM | POA: Diagnosis not present

## 2018-12-17 NOTE — Telephone Encounter (Signed)
Can stop

## 2018-12-17 NOTE — Telephone Encounter (Signed)
It depends how blurry the vision it is if it is just minimal blurriness I would give it a few days if it does not clear up by then I would recommend seeing eye specialist   if it significant blurriness or double vision I believe the patient should be seen by optometry/ophthalmology  If he needs our assistance at setting up the appointment please let us know

## 2018-12-17 NOTE — Telephone Encounter (Signed)
Left message to return call 

## 2018-12-17 NOTE — Telephone Encounter (Signed)
Pt states just minimal blurriness, he had appt with eye specialist this week but had to reschedule for next week. Its for his yearly exam. He states the main reason he called 3 days ago was to see how long he should be using sulfacetamide eye drops. He has been using them for 7 days now did not use today.

## 2018-12-17 NOTE — Telephone Encounter (Signed)
Pt.notified

## 2018-12-23 DIAGNOSIS — J301 Allergic rhinitis due to pollen: Secondary | ICD-10-CM | POA: Diagnosis not present

## 2018-12-23 DIAGNOSIS — J3089 Other allergic rhinitis: Secondary | ICD-10-CM | POA: Diagnosis not present

## 2018-12-23 DIAGNOSIS — J3081 Allergic rhinitis due to animal (cat) (dog) hair and dander: Secondary | ICD-10-CM | POA: Diagnosis not present

## 2018-12-26 ENCOUNTER — Other Ambulatory Visit (INDEPENDENT_AMBULATORY_CARE_PROVIDER_SITE_OTHER): Payer: Self-pay | Admitting: Family Medicine

## 2018-12-26 DIAGNOSIS — E559 Vitamin D deficiency, unspecified: Secondary | ICD-10-CM

## 2018-12-30 DIAGNOSIS — J301 Allergic rhinitis due to pollen: Secondary | ICD-10-CM | POA: Diagnosis not present

## 2019-01-05 DIAGNOSIS — J3089 Other allergic rhinitis: Secondary | ICD-10-CM | POA: Diagnosis not present

## 2019-01-05 DIAGNOSIS — J3081 Allergic rhinitis due to animal (cat) (dog) hair and dander: Secondary | ICD-10-CM | POA: Diagnosis not present

## 2019-01-05 DIAGNOSIS — J301 Allergic rhinitis due to pollen: Secondary | ICD-10-CM | POA: Diagnosis not present

## 2019-01-06 ENCOUNTER — Ambulatory Visit (INDEPENDENT_AMBULATORY_CARE_PROVIDER_SITE_OTHER): Payer: 59 | Admitting: Family Medicine

## 2019-01-10 ENCOUNTER — Ambulatory Visit (INDEPENDENT_AMBULATORY_CARE_PROVIDER_SITE_OTHER): Payer: 59 | Admitting: Family Medicine

## 2019-01-10 ENCOUNTER — Encounter (INDEPENDENT_AMBULATORY_CARE_PROVIDER_SITE_OTHER): Payer: Self-pay | Admitting: Family Medicine

## 2019-01-10 VITALS — BP 109/67 | HR 85 | Temp 98.0°F | Ht 73.0 in | Wt 232.0 lb

## 2019-01-10 DIAGNOSIS — J301 Allergic rhinitis due to pollen: Secondary | ICD-10-CM | POA: Diagnosis not present

## 2019-01-10 DIAGNOSIS — E559 Vitamin D deficiency, unspecified: Secondary | ICD-10-CM

## 2019-01-10 DIAGNOSIS — Z9189 Other specified personal risk factors, not elsewhere classified: Secondary | ICD-10-CM

## 2019-01-10 DIAGNOSIS — Z6830 Body mass index (BMI) 30.0-30.9, adult: Secondary | ICD-10-CM | POA: Diagnosis not present

## 2019-01-10 DIAGNOSIS — J3089 Other allergic rhinitis: Secondary | ICD-10-CM | POA: Diagnosis not present

## 2019-01-10 DIAGNOSIS — E669 Obesity, unspecified: Secondary | ICD-10-CM | POA: Diagnosis not present

## 2019-01-10 DIAGNOSIS — J3081 Allergic rhinitis due to animal (cat) (dog) hair and dander: Secondary | ICD-10-CM | POA: Diagnosis not present

## 2019-01-11 MED ORDER — VITAMIN D (ERGOCALCIFEROL) 1.25 MG (50000 UNIT) PO CAPS: 50000.0000 [IU] | ORAL_CAPSULE | ORAL | 0 refills | Status: DC

## 2019-01-11 NOTE — Progress Notes (Signed)
Office: [phone]  /  Fax: [phone]   HPI:   Chief Complaint: OBESITY Darrell Mckinney is here to discuss his progress with his obesity treatment plan. He is on the lower carbohydrate, vegetable, and lean protein rich diet plan and is following his eating plan approximately 80 to 90 % of the time. He states he is doing cardio and weight lifting for 90 to 120 minutes 4 times per week. Darrell Mckinney continues to do well with weight loss on the low carb plan. He has increased exercise and feels better overall. He is going through some stress with his 52 year old son, but is not turning to food for comfort.  His weight is 232 lb (105.2 kg) today and has had a weight loss of 3 pounds over a period of 5 weeks since his last visit. He has lost 19 lbs since starting treatment with [name].  Vitamin D deficiency Darrell Mckinney has a diagnosis of vitamin D deficiency. He is currently stable on vit D, but is not yet at goal. He denies nausea, vomiting, or muscle weakness.  At risk for osteopenia and osteoporosis Darrell Mckinney is at higher risk of osteopenia and osteoporosis due to vitamin D deficiency.   ASSESSMENT AND PLAN:  Vitamin D deficiency - Plan: Vitamin D, Ergocalciferol, (DRISDOL) 1.25 MG (50000 UT) CAPS capsule  At risk for osteoporosis  Class 1 obesity with serious comorbidity and body mass index (BMI) of 30.0 to 30.9 in adult, unspecified obesity type  PLAN:  Vitamin D Deficiency Darrell Mckinney was informed that low vitamin D levels contributes to fatigue and are associated with obesity, breast, and colon cancer. Darrell Mckinney agrees to continue to take prescription Vit D @50 ,000 IU every week #4 with no refills and will follow up for routine testing of vitamin D, at least 2-3 times per year. He was informed of the risk of over-replacement of vitamin D and agrees to not increase her dose unless she discusses this with [name] first. Darrell Mckinney agrees to follow up in 4 weeks as directed.  At risk for osteopenia and  osteoporosis Darrell Mckinney was given extended (15 minutes) osteoporosis prevention counseling today. Darrell Mckinney is at risk for osteopenia and osteoporosis due to his vitamin D deficiency. He was encouraged to take his vitamin D and follow his higher calcium diet and increase strengthening exercise to help strengthen his bones and decrease his risk of osteopenia and osteoporosis.  Obesity Darrell Mckinney is currently in the action stage of change. As such, his goal is to continue with weight loss efforts. He has agreed to follow a lower carbohydrate, vegetable, and lean protein rich diet plan. Darrell Mckinney has been instructed to work up to a goal of 150 minutes of combined cardio and strengthening exercise per week for weight loss and overall health benefits. We discussed the following Behavioral Modification Strategies today: increasing lean protein intake and decreasing simple carbohydrates.   Darrell Mckinney has agreed to follow up with our clinic in 4 weeks. He was informed of the importance of frequent follow up visits to maximize his success with intensive lifestyle modifications for his multiple health conditions.  ALLERGIES: No Known Allergies  MEDICATIONS: Current Outpatient Medications on File Prior to Visit  Medication Sig Dispense Refill  . B Complex Vitamins (VITAMIN B COMPLEX PO) Take 1 tablet by mouth daily.    Marland Kitchen ibuprofen (ADVIL,MOTRIN) 800 MG tablet Take 800 mg by mouth every 6 (six) hours as needed.     . montelukast (SINGULAIR) 10 MG tablet Take 10 mg by mouth daily.     Marland Kitchen  pantoprazole (PROTONIX) 40 MG tablet Take 40 mg by mouth daily.     No current facility-administered medications on file prior to visit.     PAST MEDICAL HISTORY: Past Medical History:  Diagnosis Date  . Acid reflux   . Back pain   . Bulging lumbar disc   . Fatigue   . GERD (gastroesophageal reflux disease)   . Hay fever   . History of basal cell cancer 12/09/2018   Removed December 2020 left inner eye skin fold  . Joint  pain   . Muscle pain   . Sciatic pain   . Seasonal allergies     PAST SURGICAL HISTORY: Past Surgical History:  Procedure Laterality Date  . BACK SURGERY  ~2006  . COLONOSCOPY  12/2008   RMR: normal  . COLONOSCOPY N/A 10/26/2015   Procedure: COLONOSCOPY;  Surgeon: Daneil Dolin, MD;  Location: [facility];  Service: Endoscopy;  Laterality: N/A;  11:15 - moved to 12:00 - pt knows to arrive at 11:00  . TURBINATE RESECTION Bilateral 01/25/2013   Procedure: BILATERAL TURBINATE RESECTION;  Surgeon: Ascencion Dike, MD;  Location: [facility];  Service: ENT;  Laterality: Bilateral;    SOCIAL HISTORY: Social History   Tobacco Use  . Smoking status: Never Smoker  . Smokeless tobacco: Never Used  Substance Use Topics  . Alcohol use: Yes    Alcohol/week: 0.0 standard drinks    Comment: six pack per week  . Drug use: No    FAMILY HISTORY: Family History  Problem Relation Age of Onset  . Colon cancer Father        dx age 37, deceased age 64  . Colon cancer Paternal Uncle        age 52s  . Stroke Mother     ROS: Review of Systems  Constitutional: Positive for weight loss.  Gastrointestinal: Negative for nausea and vomiting.  Musculoskeletal:       Negative for muscle weakness.    PHYSICAL EXAM: Blood pressure 109/67, pulse 85, temperature 98 F (36.7 C), temperature source Oral, height 6\' 1"  (1.854 m), weight 232 lb (105.2 kg), SpO2 96 %. Body mass index is 30.61 kg/m. Physical Exam Vitals signs reviewed.  Constitutional:      Appearance: Normal appearance. He is obese.  Cardiovascular:     Rate and Rhythm: Normal rate.  Pulmonary:     Effort: Pulmonary effort is normal.  Musculoskeletal: Normal range of motion.  Skin:    General: Skin is warm and dry.  Neurological:     Mental Status: He is alert and oriented to person, place, and time.  Psychiatric:        Mood and Affect: Mood normal.        Behavior: Behavior normal.     RECENT LABS AND  TESTS: BMET    Component Value Date/Time   NA 143 10/19/2018 1103   K 4.4 10/19/2018 1103   CL 102 10/19/2018 1103   CO2 24 10/19/2018 1103   GLUCOSE 101 (H) 10/19/2018 1103   BUN 14 10/19/2018 1103   CREATININE 1.16 10/19/2018 1103   CALCIUM 10.0 10/19/2018 1103   GFRNONAA 73 10/19/2018 1103   GFRAA 84 10/19/2018 1103   Lab Results  Component Value Date   HGBA1C 5.2 10/19/2018   HGBA1C 5.1 06/02/2018   HGBA1C 5.1 02/08/2018   Lab Results  Component Value Date   INSULIN 17.3 10/19/2018   INSULIN 24.1 06/02/2018   INSULIN 18.8 02/08/2018  CBC    Component Value Date/Time   WBC 5.5 02/08/2018 0943   RBC 4.71 02/08/2018 0943   HGB 15.6 02/08/2018 0943   HCT 46.3 02/08/2018 0943   MCV 98 (H) 02/08/2018 0943   MCH 33.1 (H) 02/08/2018 0943   MCHC 33.7 02/08/2018 0943   RDW 12.7 02/08/2018 0943   LYMPHSABS 1.8 02/08/2018 0943   EOSABS 0.1 02/08/2018 0943   BASOSABS 0.0 02/08/2018 0943   Iron/TIBC/Ferritin/ %Sat No results found for: IRON, TIBC, FERRITIN, IRONPCTSAT Lipid Panel     Component Value Date/Time   CHOL 120 10/19/2018 1103   TRIG 133 10/19/2018 1103   HDL 41 10/19/2018 1103   LDLCALC 52 10/19/2018 1103   Hepatic Function Panel     Component Value Date/Time   PROT 7.2 10/19/2018 1103   ALBUMIN 4.9 10/19/2018 1103   AST 22 10/19/2018 1103   ALT 32 10/19/2018 1103   ALKPHOS 56 10/19/2018 1103   BILITOT 0.7 10/19/2018 1103   BILIDIR 0.15 09/21/2018 0812      Component Value Date/Time   TSH 1.740 02/08/2018 0943   Results for KASHIUS, DOMINIC (MRN [id number]) as of 01/11/2019 09:58  Ref. Range 10/19/2018 11:03  Vitamin D, 25-Hydroxy Latest Ref Range: 30.0 - 100.0 ng/mL 44.5    OBESITY BEHAVIORAL INTERVENTION VISIT  Today's visit was # 13   Starting weight: 251 lbs Starting date: 02/08/18 Today's weight : Weight: 232 lb (105.2 kg)  Today's date: 01/10/2019 Total lbs lost to date: 19    Most Recent Value 01/15/2014 - 01/14/2019  Height 6\' 1"   (1.854 m) 01/10/2019  Weight 232 lb (105.2 kg) 01/10/2019  BMI (Calculated) 30.62 01/10/2019  BLOOD PRESSURE - SYSTOLIC 270 05/17/7627  BLOOD PRESSURE - DIASTOLIC 67 02/06/1760  Waist Measurement  36 inches 02/08/2018   Body Fat % 24.3 % 01/10/2019  Total Body Water (lbs) 115.8 lbs 01/10/2019  RMR 2062 02/08/2018   ASK: We discussed the diagnosis of obesity with [name] today and Legrand Como agreed to give [name] permission to discuss obesity behavioral modification therapy today.  ASSESS: Vale has the diagnosis of obesity and his BMI today is 30.6. Korrey is in the action stage of change.   ADVISE: Juanito was educated on the multiple health risks of obesity as well as the benefit of weight loss to improve his health. He was advised of the need for long term treatment and the importance of lifestyle modifications to improve his current health and to decrease his risk of future health problems.  AGREE: Multiple dietary modification options and treatment options were discussed and Leno agreed to follow the recommendations documented in the above note.  ARRANGE: Finnegan was educated on the importance of frequent visits to treat obesity as outlined per CMS and USPSTF guidelines and agreed to schedule his next follow up appointment today.  IMarcille Blanco, CMA, am acting as transcriptionist for Starlyn Skeans, MD  I have reviewed the above documentation for accuracy and completeness, and I agree with the above. -Dennard Nip, MD

## 2019-01-19 DIAGNOSIS — J301 Allergic rhinitis due to pollen: Secondary | ICD-10-CM | POA: Diagnosis not present

## 2019-01-19 DIAGNOSIS — J3081 Allergic rhinitis due to animal (cat) (dog) hair and dander: Secondary | ICD-10-CM | POA: Diagnosis not present

## 2019-01-19 DIAGNOSIS — J3089 Other allergic rhinitis: Secondary | ICD-10-CM | POA: Diagnosis not present

## 2019-01-26 DIAGNOSIS — J3081 Allergic rhinitis due to animal (cat) (dog) hair and dander: Secondary | ICD-10-CM | POA: Diagnosis not present

## 2019-01-26 DIAGNOSIS — J3089 Other allergic rhinitis: Secondary | ICD-10-CM | POA: Diagnosis not present

## 2019-01-26 DIAGNOSIS — J301 Allergic rhinitis due to pollen: Secondary | ICD-10-CM | POA: Diagnosis not present

## 2019-01-30 ENCOUNTER — Other Ambulatory Visit (INDEPENDENT_AMBULATORY_CARE_PROVIDER_SITE_OTHER): Payer: Self-pay | Admitting: Family Medicine

## 2019-01-30 DIAGNOSIS — E559 Vitamin D deficiency, unspecified: Secondary | ICD-10-CM

## 2019-01-31 DIAGNOSIS — J301 Allergic rhinitis due to pollen: Secondary | ICD-10-CM | POA: Diagnosis not present

## 2019-01-31 DIAGNOSIS — J3089 Other allergic rhinitis: Secondary | ICD-10-CM | POA: Diagnosis not present

## 2019-01-31 DIAGNOSIS — J3081 Allergic rhinitis due to animal (cat) (dog) hair and dander: Secondary | ICD-10-CM | POA: Diagnosis not present

## 2019-02-07 ENCOUNTER — Ambulatory Visit (INDEPENDENT_AMBULATORY_CARE_PROVIDER_SITE_OTHER): Payer: 59 | Admitting: Family Medicine

## 2019-02-07 ENCOUNTER — Encounter (INDEPENDENT_AMBULATORY_CARE_PROVIDER_SITE_OTHER): Payer: Self-pay

## 2019-02-07 ENCOUNTER — Telehealth (INDEPENDENT_AMBULATORY_CARE_PROVIDER_SITE_OTHER): Payer: Self-pay | Admitting: Family Medicine

## 2019-02-07 NOTE — Telephone Encounter (Signed)
Patient called cancelled his appt with Dr. Leafy Ro today due to CV-19. He travels for his work. He will be traveling for the next 2 wks.   The patient needs a refill of Vitamin D at [facility], [place].   Please, call when this is ready.   Thanks,

## 2019-02-08 DIAGNOSIS — J3089 Other allergic rhinitis: Secondary | ICD-10-CM | POA: Diagnosis not present

## 2019-02-08 DIAGNOSIS — J301 Allergic rhinitis due to pollen: Secondary | ICD-10-CM | POA: Diagnosis not present

## 2019-02-08 DIAGNOSIS — J3081 Allergic rhinitis due to animal (cat) (dog) hair and dander: Secondary | ICD-10-CM | POA: Diagnosis not present

## 2019-02-16 DIAGNOSIS — J3089 Other allergic rhinitis: Secondary | ICD-10-CM | POA: Diagnosis not present

## 2019-02-16 DIAGNOSIS — J301 Allergic rhinitis due to pollen: Secondary | ICD-10-CM | POA: Diagnosis not present

## 2019-02-16 DIAGNOSIS — J3081 Allergic rhinitis due to animal (cat) (dog) hair and dander: Secondary | ICD-10-CM | POA: Diagnosis not present

## 2019-02-17 ENCOUNTER — Encounter (INDEPENDENT_AMBULATORY_CARE_PROVIDER_SITE_OTHER): Payer: Self-pay

## 2019-02-23 DIAGNOSIS — J3081 Allergic rhinitis due to animal (cat) (dog) hair and dander: Secondary | ICD-10-CM | POA: Diagnosis not present

## 2019-02-23 DIAGNOSIS — J301 Allergic rhinitis due to pollen: Secondary | ICD-10-CM | POA: Diagnosis not present

## 2019-02-23 DIAGNOSIS — J3089 Other allergic rhinitis: Secondary | ICD-10-CM | POA: Diagnosis not present

## 2019-03-08 DIAGNOSIS — J301 Allergic rhinitis due to pollen: Secondary | ICD-10-CM | POA: Diagnosis not present

## 2019-03-08 DIAGNOSIS — J3081 Allergic rhinitis due to animal (cat) (dog) hair and dander: Secondary | ICD-10-CM | POA: Diagnosis not present

## 2019-03-08 DIAGNOSIS — J3089 Other allergic rhinitis: Secondary | ICD-10-CM | POA: Diagnosis not present

## 2019-03-14 DIAGNOSIS — M5442 Lumbago with sciatica, left side: Secondary | ICD-10-CM | POA: Diagnosis not present

## 2019-03-14 DIAGNOSIS — M5137 Other intervertebral disc degeneration, lumbosacral region: Secondary | ICD-10-CM | POA: Diagnosis not present

## 2019-03-14 DIAGNOSIS — M9903 Segmental and somatic dysfunction of lumbar region: Secondary | ICD-10-CM | POA: Diagnosis not present

## 2019-03-16 DIAGNOSIS — J3089 Other allergic rhinitis: Secondary | ICD-10-CM | POA: Diagnosis not present

## 2019-03-16 DIAGNOSIS — J301 Allergic rhinitis due to pollen: Secondary | ICD-10-CM | POA: Diagnosis not present

## 2019-03-16 DIAGNOSIS — J3081 Allergic rhinitis due to animal (cat) (dog) hair and dander: Secondary | ICD-10-CM | POA: Diagnosis not present

## 2019-03-17 DIAGNOSIS — M5442 Lumbago with sciatica, left side: Secondary | ICD-10-CM | POA: Diagnosis not present

## 2019-03-17 DIAGNOSIS — M9903 Segmental and somatic dysfunction of lumbar region: Secondary | ICD-10-CM | POA: Diagnosis not present

## 2019-03-17 DIAGNOSIS — M5137 Other intervertebral disc degeneration, lumbosacral region: Secondary | ICD-10-CM | POA: Diagnosis not present

## 2019-03-21 DIAGNOSIS — J301 Allergic rhinitis due to pollen: Secondary | ICD-10-CM | POA: Diagnosis not present

## 2019-03-21 DIAGNOSIS — J3081 Allergic rhinitis due to animal (cat) (dog) hair and dander: Secondary | ICD-10-CM | POA: Diagnosis not present

## 2019-03-21 DIAGNOSIS — J3089 Other allergic rhinitis: Secondary | ICD-10-CM | POA: Diagnosis not present

## 2019-03-22 DIAGNOSIS — M5137 Other intervertebral disc degeneration, lumbosacral region: Secondary | ICD-10-CM | POA: Diagnosis not present

## 2019-03-22 DIAGNOSIS — M5442 Lumbago with sciatica, left side: Secondary | ICD-10-CM | POA: Diagnosis not present

## 2019-03-22 DIAGNOSIS — M9903 Segmental and somatic dysfunction of lumbar region: Secondary | ICD-10-CM | POA: Diagnosis not present

## 2019-03-25 DIAGNOSIS — M5137 Other intervertebral disc degeneration, lumbosacral region: Secondary | ICD-10-CM | POA: Diagnosis not present

## 2019-03-25 DIAGNOSIS — M5442 Lumbago with sciatica, left side: Secondary | ICD-10-CM | POA: Diagnosis not present

## 2019-03-25 DIAGNOSIS — M9903 Segmental and somatic dysfunction of lumbar region: Secondary | ICD-10-CM | POA: Diagnosis not present

## 2019-03-28 DIAGNOSIS — J3089 Other allergic rhinitis: Secondary | ICD-10-CM | POA: Diagnosis not present

## 2019-03-28 DIAGNOSIS — M5442 Lumbago with sciatica, left side: Secondary | ICD-10-CM | POA: Diagnosis not present

## 2019-03-28 DIAGNOSIS — J3081 Allergic rhinitis due to animal (cat) (dog) hair and dander: Secondary | ICD-10-CM | POA: Diagnosis not present

## 2019-03-28 DIAGNOSIS — M5137 Other intervertebral disc degeneration, lumbosacral region: Secondary | ICD-10-CM | POA: Diagnosis not present

## 2019-03-28 DIAGNOSIS — M9903 Segmental and somatic dysfunction of lumbar region: Secondary | ICD-10-CM | POA: Diagnosis not present

## 2019-03-28 DIAGNOSIS — J301 Allergic rhinitis due to pollen: Secondary | ICD-10-CM | POA: Diagnosis not present

## 2019-04-01 DIAGNOSIS — M5442 Lumbago with sciatica, left side: Secondary | ICD-10-CM | POA: Diagnosis not present

## 2019-04-01 DIAGNOSIS — J3081 Allergic rhinitis due to animal (cat) (dog) hair and dander: Secondary | ICD-10-CM | POA: Diagnosis not present

## 2019-04-01 DIAGNOSIS — M9903 Segmental and somatic dysfunction of lumbar region: Secondary | ICD-10-CM | POA: Diagnosis not present

## 2019-04-01 DIAGNOSIS — J301 Allergic rhinitis due to pollen: Secondary | ICD-10-CM | POA: Diagnosis not present

## 2019-04-01 DIAGNOSIS — J3089 Other allergic rhinitis: Secondary | ICD-10-CM | POA: Diagnosis not present

## 2019-04-01 DIAGNOSIS — M5137 Other intervertebral disc degeneration, lumbosacral region: Secondary | ICD-10-CM | POA: Diagnosis not present

## 2019-04-04 DIAGNOSIS — M5137 Other intervertebral disc degeneration, lumbosacral region: Secondary | ICD-10-CM | POA: Diagnosis not present

## 2019-04-04 DIAGNOSIS — J3081 Allergic rhinitis due to animal (cat) (dog) hair and dander: Secondary | ICD-10-CM | POA: Diagnosis not present

## 2019-04-04 DIAGNOSIS — M9903 Segmental and somatic dysfunction of lumbar region: Secondary | ICD-10-CM | POA: Diagnosis not present

## 2019-04-04 DIAGNOSIS — J301 Allergic rhinitis due to pollen: Secondary | ICD-10-CM | POA: Diagnosis not present

## 2019-04-04 DIAGNOSIS — M5442 Lumbago with sciatica, left side: Secondary | ICD-10-CM | POA: Diagnosis not present

## 2019-04-04 DIAGNOSIS — J3089 Other allergic rhinitis: Secondary | ICD-10-CM | POA: Diagnosis not present

## 2019-04-06 DIAGNOSIS — M9903 Segmental and somatic dysfunction of lumbar region: Secondary | ICD-10-CM | POA: Diagnosis not present

## 2019-04-06 DIAGNOSIS — M5442 Lumbago with sciatica, left side: Secondary | ICD-10-CM | POA: Diagnosis not present

## 2019-04-06 DIAGNOSIS — M5137 Other intervertebral disc degeneration, lumbosacral region: Secondary | ICD-10-CM | POA: Diagnosis not present

## 2019-04-08 DIAGNOSIS — M5442 Lumbago with sciatica, left side: Secondary | ICD-10-CM | POA: Diagnosis not present

## 2019-04-08 DIAGNOSIS — M9903 Segmental and somatic dysfunction of lumbar region: Secondary | ICD-10-CM | POA: Diagnosis not present

## 2019-04-08 DIAGNOSIS — M5137 Other intervertebral disc degeneration, lumbosacral region: Secondary | ICD-10-CM | POA: Diagnosis not present

## 2019-04-11 DIAGNOSIS — M5137 Other intervertebral disc degeneration, lumbosacral region: Secondary | ICD-10-CM | POA: Diagnosis not present

## 2019-04-11 DIAGNOSIS — M5442 Lumbago with sciatica, left side: Secondary | ICD-10-CM | POA: Diagnosis not present

## 2019-04-11 DIAGNOSIS — M9903 Segmental and somatic dysfunction of lumbar region: Secondary | ICD-10-CM | POA: Diagnosis not present

## 2019-04-12 DIAGNOSIS — M545 Low back pain: Secondary | ICD-10-CM | POA: Diagnosis not present

## 2019-04-12 DIAGNOSIS — M5136 Other intervertebral disc degeneration, lumbar region: Secondary | ICD-10-CM | POA: Diagnosis not present

## 2019-04-13 DIAGNOSIS — M5442 Lumbago with sciatica, left side: Secondary | ICD-10-CM | POA: Diagnosis not present

## 2019-04-13 DIAGNOSIS — M5137 Other intervertebral disc degeneration, lumbosacral region: Secondary | ICD-10-CM | POA: Diagnosis not present

## 2019-04-13 DIAGNOSIS — M9903 Segmental and somatic dysfunction of lumbar region: Secondary | ICD-10-CM | POA: Diagnosis not present

## 2019-04-22 ENCOUNTER — Telehealth: Payer: Self-pay | Admitting: Family Medicine

## 2019-04-22 NOTE — Telephone Encounter (Signed)
Patient had MRI done today and would like your opinion on the results. The report in your folder in your box. Please advise

## 2019-04-25 ENCOUNTER — Encounter: Payer: Self-pay | Admitting: Family Medicine

## 2019-04-25 NOTE — Telephone Encounter (Signed)
Patient will be seen thoracic spine specialist tomorrow we did discuss this with him he will notify us if it if he needs anything additional

## 2019-04-28 ENCOUNTER — Ambulatory Visit
Admission: EM | Admit: 2019-04-28 | Discharge: 2019-04-28 | Disposition: A | Discharge status: Home / Self Care | Payer: 59

## 2019-04-28 DIAGNOSIS — S0181XA Laceration without foreign body of other part of head, initial encounter: Secondary | ICD-10-CM

## 2019-04-28 NOTE — ED Triage Notes (Signed)
Pt hit left brow area last night with hammer , small laceration noted, skin is approximated

## 2019-04-28 NOTE — ED Provider Notes (Signed)
Epic went down prior to visit.  Please see scanned chart for today's visit.     Lestine Box, PA-C 04/28/19 1249

## 2019-04-28 NOTE — Discharge Instructions (Addendum)
Steri strips applied Will follow up here or with PCP as needed

## 2019-04-28 NOTE — ED Notes (Signed)
Epic went down during triage

## 2019-04-29 ENCOUNTER — Other Ambulatory Visit: Payer: Self-pay | Admitting: *Deleted

## 2019-04-29 ENCOUNTER — Encounter: Payer: Self-pay | Admitting: Family Medicine

## 2019-04-29 MED ORDER — PANTOPRAZOLE SODIUM 40 MG PO TBEC: 40.0000 mg | DELAYED_RELEASE_TABLET | Freq: Every day | ORAL | 0 refills | Status: DC

## 2019-05-17 ENCOUNTER — Encounter: Payer: Self-pay | Admitting: Family Medicine

## 2019-05-18 ENCOUNTER — Encounter: Payer: Self-pay | Admitting: Family Medicine

## 2019-06-23 ENCOUNTER — Telehealth: Payer: Self-pay | Admitting: Family Medicine

## 2019-06-23 DIAGNOSIS — Z125 Encounter for screening for malignant neoplasm of prostate: Secondary | ICD-10-CM

## 2019-06-23 DIAGNOSIS — E7849 Other hyperlipidemia: Secondary | ICD-10-CM

## 2019-06-23 DIAGNOSIS — Z0000 Encounter for general adult medical examination without abnormal findings: Secondary | ICD-10-CM

## 2019-06-23 NOTE — Telephone Encounter (Signed)
Last labs 10/19/18 insulin, vit d, a1c, lipid, cmp

## 2019-06-23 NOTE — Telephone Encounter (Signed)
I would recommend lipid, liver, metabolic 7, PSA  [facility] did do a vitamin D level A1c earlier this year which looked good I do not recommend repeating that at this time unless patient desires to do so

## 2019-06-23 NOTE — Telephone Encounter (Signed)
Discussed with pt and he did not want to do vit d or a1c if not needed. Other orders put in and pt states he will get it done before physical

## 2019-06-23 NOTE — Telephone Encounter (Signed)
Patient is scheduled for his Physical in August and would like orders to have labwork done at [facility] prior to appt.

## 2019-07-07 LAB — BASIC METABOLIC PANEL
BUN/Creatinine Ratio: 13 (ref 9–20)
BUN: 14 mg/dL (ref 6–24)
CO2: 23 mmol/L (ref 20–29)
Calcium: 9.7 mg/dL (ref 8.7–10.2)
Chloride: 102 mmol/L (ref 96–106)
Creatinine, Ser: 1.07 mg/dL (ref 0.76–1.27)
GFR calc Af Amer: 92 mL/min/{1.73_m2} (ref >59)
GFR calc non Af Amer: 79 mL/min/{1.73_m2} (ref >59)
Glucose: 115 mg/dL — ABNORMAL HIGH (ref 65–99)
Potassium: 4.5 mmol/L (ref 3.5–5.2)
Sodium: 139 mmol/L (ref 134–144)

## 2019-07-07 LAB — HEPATIC FUNCTION PANEL
ALT: 65 IU/L — ABNORMAL HIGH (ref 0–44)
AST: 35 IU/L (ref 0–40)
Albumin: 4.8 g/dL (ref 3.8–4.9)
Alkaline Phosphatase: 53 IU/L (ref 39–117)
Bilirubin Total: 0.7 mg/dL (ref 0.0–1.2)
Bilirubin, Direct: 0.17 mg/dL (ref 0.00–0.40)
Total Protein: 6.8 g/dL (ref 6.0–8.5)

## 2019-07-07 LAB — LIPID PANEL
Chol/HDL Ratio: 4.4 ratio (ref 0.0–5.0)
Cholesterol, Total: 157 mg/dL (ref 100–199)
HDL: 36 mg/dL — ABNORMAL LOW (ref >39)
LDL Calculated: 68 mg/dL (ref 0–99)
Triglycerides: 265 mg/dL — ABNORMAL HIGH (ref 0–149)
VLDL Cholesterol Cal: 53 mg/dL — ABNORMAL HIGH (ref 5–40)

## 2019-07-07 LAB — PSA: Prostate Specific Ag, Serum: 0.5 ng/mL (ref 0.0–4.0)

## 2019-07-13 ENCOUNTER — Encounter: Payer: Self-pay | Admitting: Family Medicine

## 2019-07-13 ENCOUNTER — Other Ambulatory Visit: Payer: Self-pay

## 2019-07-13 ENCOUNTER — Ambulatory Visit (INDEPENDENT_AMBULATORY_CARE_PROVIDER_SITE_OTHER): Payer: 59 | Admitting: Family Medicine

## 2019-07-13 VITALS — BP 124/78 | Temp 98.0°F | Ht 72.0 in | Wt 250.6 lb

## 2019-07-13 DIAGNOSIS — Z0000 Encounter for general adult medical examination without abnormal findings: Secondary | ICD-10-CM

## 2019-07-13 NOTE — Progress Notes (Addendum)
Subjective:    Patient ID: Darrell Mckinney, male    DOB: 11-14-1966, 53 y.o.   MRN: [id number]  HPI The patient comes in today for a wellness visit. Patient is trying hard to watch diet trying to hard to watch his weight but it is been going up he had recent lab work which did show some problem areas we did discuss these today Patient is doing some exercise and watching diet but he admits that alcohol intake is higher than what it normally is he states he can cut back    A review of their health history was completed.  A review of medications was also completed.  Any needed refills; none at this time  Eating habits: eats well overall  Falls/  MVA accidents in past few months: none  Regular exercise: as much as he can with everything shut down   Specialist pt sees on regular basis: Allergy specialist; healthy weight and wellness  Preventative health issues were discussed.   Additional concerns: none at this time   Review of Systems  Constitutional: Negative for activity change, appetite change and fever.  HENT: Negative for congestion and rhinorrhea.   Eyes: Negative for discharge.  Respiratory: Negative for cough and wheezing.   Cardiovascular: Negative for chest pain.  Gastrointestinal: Negative for abdominal pain, blood in stool and vomiting.  Genitourinary: Negative for difficulty urinating and frequency.  Musculoskeletal: Negative for neck pain.  Skin: Negative for rash.  Allergic/Immunologic: Negative for environmental allergies and food allergies.  Neurological: Negative for weakness and headaches.  Psychiatric/Behavioral: Negative for agitation.       Objective:   Physical Exam Constitutional:      Appearance: He is well-developed.  HENT:     Head: Normocephalic and atraumatic.     Right Ear: External ear normal.     Left Ear: External ear normal.     Nose: Nose normal.  Eyes:     Pupils: Pupils are equal, round, and reactive to light.  Neck:   Musculoskeletal: Normal range of motion and neck supple.     Thyroid: No thyromegaly.  Cardiovascular:     Rate and Rhythm: Normal rate and regular rhythm.     Heart sounds: Normal heart sounds. No murmur.  Pulmonary:     Effort: Pulmonary effort is normal. No respiratory distress.     Breath sounds: Normal breath sounds. No wheezing.  Abdominal:     General: Bowel sounds are normal. There is no distension.     Palpations: Abdomen is soft. There is no mass.     Tenderness: There is no abdominal tenderness.  Genitourinary:    Penis: Normal.      Prostate: Normal.  Musculoskeletal: Normal range of motion.  Lymphadenopathy:     Cervical: No cervical adenopathy.  Skin:    General: Skin is warm and dry.     Findings: No erythema.  Neurological:     Mental Status: He is alert.     Motor: No abnormal muscle tone.  Psychiatric:        Behavior: Behavior normal.        Judgment: Judgment normal.    The patient does have a raised area on the left forearm near his wrist it is concerning for the possibility of actinic keratosis I recommended he see dermatology he states he will call the skin surgery Center in [place] if he needs a referral he will let us know No sign of any interval enlargement of liver Results  for orders placed or performed in visit on 06/23/19  Lipid panel  Result Value Ref Range   Cholesterol, Total 157 100 - 199 mg/dL   Triglycerides 265 (H) 0 - 149 mg/dL   HDL 36 (L) >39 mg/dL   VLDL Cholesterol Cal 53 (H) 5 - 40 mg/dL   LDL Calculated 68 0 - 99 mg/dL   Chol/HDL Ratio 4.4 0.0 - 5.0 ratio  Hepatic function panel  Result Value Ref Range   Total Protein 6.8 6.0 - 8.5 g/dL   Albumin 4.8 3.8 - 4.9 g/dL   Bilirubin Total 0.7 0.0 - 1.2 mg/dL   Bilirubin, Direct 0.17 0.00 - 0.40 mg/dL   Alkaline Phosphatase 53 39 - 117 IU/L   AST 35 0 - 40 IU/L   ALT 65 (H) 0 - 44 IU/L  Basic metabolic panel  Result Value Ref Range   Glucose 115 (H) 65 - 99 mg/dL   BUN 14 6 -  24 mg/dL   Creatinine, Ser 1.07 0.76 - 1.27 mg/dL   GFR calc non Af Amer 79 >59 mL/min/1.73   GFR calc Af Amer 92 >59 mL/min/1.73   BUN/Creatinine Ratio 13 9 - 20   Sodium 139 134 - 144 mmol/L   Potassium 4.5 3.5 - 5.2 mmol/L   Chloride 102 96 - 106 mmol/L   CO2 23 20 - 29 mmol/L   Calcium 9.7 8.7 - 10.2 mg/dL  PSA  Result Value Ref Range   Prostate Specific Ag, Serum 0.5 0.0 - 4.0 ng/mL    Prostate exam normal     Assessment & Plan:  Adult wellness-complete.wellness physical was conducted today. Importance of diet and exercise were discussed in detail.  In addition to this a discussion regarding safety was also covered. We also reviewed over immunizations and gave recommendations regarding current immunization needed for age.  In addition to this additional areas were also touched on including: Preventative health exams needed:  Colonoscopy up-to-date next 12/14/2019  Elevated liver enzymes he needs to have this repeated again in approximately 5 to 6 months if it is not where it needs to be we will need to do further testing and follow-up visit Elevated glucose need to do follow-up lab work again in 5 to 6 months if out of whack we will need to do a follow-up visit Healthy diet exercise try to lose weight discussed Triglycerides went up cut back on alcohol this should help that follow-up 5 to 6 months Patient was advised yearly wellness exam

## 2019-07-13 NOTE — Progress Notes (Signed)
Added to reminder file ?

## 2019-07-13 NOTE — Patient Instructions (Addendum)
Do follow up labs in 4 to 6 months   DASH Eating Plan DASH stands for "Dietary Approaches to Stop Hypertension." The DASH eating plan is a healthy eating plan that has been shown to reduce high blood pressure (hypertension). It may also reduce your risk for type 2 diabetes, heart disease, and stroke. The DASH eating plan may also help with weight loss. What are tips for following this plan?  General guidelines  Avoid eating more than 2,300 mg (milligrams) of salt (sodium) a day. If you have hypertension, you may need to reduce your sodium intake to 1,500 mg a day.  Limit alcohol intake to no more than 1 drink a day for nonpregnant women and 2 drinks a day for men. One drink equals 12 oz of beer, 5 oz of wine, or 1 oz of hard liquor.  Work with your health care provider to maintain a healthy body weight or to lose weight. Ask what an ideal weight is for you.  Get at least 30 minutes of exercise that causes your heart to beat faster (aerobic exercise) most days of the week. Activities may include walking, swimming, or biking.  Work with your health care provider or diet and nutrition specialist (dietitian) to adjust your eating plan to your individual calorie needs. Reading food labels   Check food labels for the amount of sodium per serving. Choose foods with less than 5 percent of the Daily Value of sodium. Generally, foods with less than 300 mg of sodium per serving fit into this eating plan.  To find whole grains, look for the word "whole" as the first word in the ingredient list. Shopping  Buy products labeled as "low-sodium" or "no salt added."  Buy fresh foods. Avoid canned foods and premade or frozen meals. Cooking  Avoid adding salt when cooking. Use salt-free seasonings or herbs instead of table salt or sea salt. Check with your health care provider or pharmacist before using salt substitutes.  Do not fry foods. Cook foods using healthy methods such as baking, boiling,  grilling, and broiling instead.  Cook with heart-healthy oils, such as olive, canola, soybean, or sunflower oil. Meal planning  Eat a balanced diet that includes: ? 5 or more servings of fruits and vegetables each day. At each meal, try to fill half of your plate with fruits and vegetables. ? Up to 6-8 servings of whole grains each day. ? Less than 6 oz of lean meat, poultry, or fish each day. A 3-oz serving of meat is about the same size as a deck of cards. One egg equals 1 oz. ? 2 servings of low-fat dairy each day. ? A serving of nuts, seeds, or beans 5 times each week. ? Heart-healthy fats. Healthy fats called Omega-3 fatty acids are found in foods such as flaxseeds and coldwater fish, like sardines, salmon, and mackerel.  Limit how much you eat of the following: ? Canned or prepackaged foods. ? Food that is high in trans fat, such as fried foods. ? Food that is high in saturated fat, such as fatty meat. ? Sweets, desserts, sugary drinks, and other foods with added sugar. ? Full-fat dairy products.  Do not salt foods before eating.  Try to eat at least 2 vegetarian meals each week.  Eat more home-cooked food and less restaurant, buffet, and fast food.  When eating at a restaurant, ask that your food be prepared with less salt or no salt, if possible. What foods are recommended? The items listed  may not be a complete list. Talk with your dietitian about what dietary choices are best for you. Grains Whole-grain or whole-wheat bread. Whole-grain or whole-wheat pasta. Brown rice. [place]. Bulgur. Whole-grain and low-sodium cereals. Pita bread. Low-fat, low-sodium crackers. Whole-wheat flour tortillas. Vegetables Fresh or frozen vegetables (raw, steamed, roasted, or grilled). Low-sodium or reduced-sodium tomato and vegetable juice. Low-sodium or reduced-sodium tomato sauce and tomato paste. Low-sodium or reduced-sodium canned vegetables. Fruits All fresh, dried, or frozen  fruit. Canned fruit in natural juice (without added sugar). Meat and other protein foods Skinless chicken or [place]. Ground chicken or [place]. Pork with fat trimmed off. Fish and seafood. Egg whites. Dried beans, peas, or lentils. Unsalted nuts, nut butters, and seeds. Unsalted canned beans. Lean cuts of beef with fat trimmed off. Low-sodium, lean deli meat. Dairy Low-fat (1%) or fat-free (skim) milk. Fat-free, low-fat, or reduced-fat cheeses. Nonfat, low-sodium ricotta or cottage cheese. Low-fat or nonfat yogurt. Low-fat, low-sodium cheese. Fats and oils Soft margarine without trans fats. Vegetable oil. Low-fat, reduced-fat, or light mayonnaise and salad dressings (reduced-sodium). Canola, safflower, olive, soybean, and sunflower oils. Avocado. Seasoning and other foods Herbs. Spices. Seasoning mixes without salt. Unsalted popcorn and pretzels. Fat-free sweets. What foods are not recommended? The items listed may not be a complete list. Talk with your dietitian about what dietary choices are best for you. Grains Baked goods made with fat, such as croissants, muffins, or some breads. Dry pasta or rice meal packs. Vegetables Creamed or fried vegetables. Vegetables in a cheese sauce. Regular canned vegetables (not low-sodium or reduced-sodium). Regular canned tomato sauce and paste (not low-sodium or reduced-sodium). Regular tomato and vegetable juice (not low-sodium or reduced-sodium). [name]. Olives. Fruits Canned fruit in a light or heavy syrup. Fried fruit. Fruit in cream or butter sauce. Meat and other protein foods Fatty cuts of meat. Ribs. Fried meat. [name]. Sausage. Bologna and other processed lunch meats. Salami. Fatback. Hotdogs. Bratwurst. Salted nuts and seeds. Canned beans with added salt. Canned or smoked fish. Whole eggs or egg yolks. Chicken or [place] with skin. Dairy Whole or 2% milk, cream, and half-and-half. Whole or full-fat cream cheese. Whole-fat or sweetened yogurt. Full-fat  cheese. Nondairy creamers. Whipped toppings. Processed cheese and cheese spreads. Fats and oils Butter. Stick margarine. Lard. Shortening. Ghee. Bacon fat. Tropical oils, such as coconut, palm kernel, or palm oil. Seasoning and other foods Salted popcorn and pretzels. Onion salt, garlic salt, seasoned salt, table salt, and sea salt. Worcestershire sauce. Tartar sauce. Barbecue sauce. Teriyaki sauce. Soy sauce, including reduced-sodium. Steak sauce. Canned and packaged gravies. Fish sauce. Oyster sauce. Cocktail sauce. Horseradish that you find on the shelf. Ketchup. Mustard. Meat flavorings and tenderizers. Bouillon cubes. Hot sauce and Tabasco sauce. Premade or packaged marinades. Premade or packaged taco seasonings. Relishes. Regular salad dressings. Where to find more information:  National Heart, Lung, and Wineglass: https://wilson-eaton.com/  American Heart Association: www.heart.org Summary  The DASH eating plan is a healthy eating plan that has been shown to reduce high blood pressure (hypertension). It may also reduce your risk for type 2 diabetes, heart disease, and stroke.  With the DASH eating plan, you should limit salt (sodium) intake to 2,300 mg a day. If you have hypertension, you may need to reduce your sodium intake to 1,500 mg a day.  When on the DASH eating plan, aim to eat more fresh fruits and vegetables, whole grains, lean proteins, low-fat dairy, and heart-healthy fats.  Work with your health care provider or diet  and nutrition specialist (dietitian) to adjust your eating plan to your individual calorie needs. This information is not intended to replace advice given to you by your health care provider. Make sure you discuss any questions you have with your health care provider. Document Released: 10/30/2011 Document Revised: 10/23/2017 Document Reviewed: 11/03/2016 Elsevier Patient Education  2020 [employer].

## 2019-07-14 NOTE — Progress Notes (Signed)
Done

## 2019-07-21 ENCOUNTER — Other Ambulatory Visit: Payer: Self-pay | Admitting: Family Medicine

## 2019-07-24 ENCOUNTER — Encounter (INDEPENDENT_AMBULATORY_CARE_PROVIDER_SITE_OTHER): Payer: Self-pay | Admitting: Family Medicine

## 2019-07-28 ENCOUNTER — Encounter: Payer: Self-pay | Admitting: Family Medicine

## 2019-09-28 ENCOUNTER — Ambulatory Visit (INDEPENDENT_AMBULATORY_CARE_PROVIDER_SITE_OTHER): Payer: 59 | Admitting: Family Medicine

## 2019-09-28 ENCOUNTER — Other Ambulatory Visit: Payer: Self-pay

## 2019-09-28 DIAGNOSIS — R7301 Impaired fasting glucose: Secondary | ICD-10-CM | POA: Diagnosis not present

## 2019-09-28 DIAGNOSIS — R7401 Elevation of levels of liver transaminase levels: Secondary | ICD-10-CM

## 2019-09-28 DIAGNOSIS — E781 Pure hyperglyceridemia: Secondary | ICD-10-CM

## 2019-09-28 DIAGNOSIS — G4700 Insomnia, unspecified: Secondary | ICD-10-CM | POA: Diagnosis not present

## 2019-09-28 MED ORDER — ZOLPIDEM TARTRATE 10 MG PO TABS: ORAL_TABLET | ORAL | 1 refills | Status: DC

## 2019-09-28 NOTE — Progress Notes (Signed)
   Subjective:    Patient ID: Darrell Mckinney, male    DOB: 07/17/1966, 53 y.o.   MRN: [id number]  HPI  Patient would like refill of Ambien 10mg  0.5 tablet PRN. Patient states he got this med in the past from previous doctor but is out of refills. Patient states his previous doctor would give him a 90 day supply with refills and it is cheaper for him that way. Patient states that the medications done well for him through the years he is an Emergency planning/management officer uses it sparingly never takes it close to when he has to get off he always appropriate at times with the medicine Patient also has elevated triglycerides glucose and transaminase on previous lab work your need to do follow-up lab work and February he is try to work hard on diet and exercise Virtual Visit via Video Note  I connected with Darrell Mckinney on 09/28/19 at  1:10 PM EST by a video enabled telemedicine application and verified that I am speaking with the correct person using two identifiers.  Location: Patient: home Provider: office   I discussed the limitations of evaluation and management by telemedicine and the availability of in person appointments. The patient expressed understanding and agreed to proceed.  History of Present Illness:    Observations/Objective:   Assessment and Plan:   Follow Up Instructions:    I discussed the assessment and treatment plan with the patient. The patient was provided an opportunity to ask questions and all were answered. The patient agreed with the plan and demonstrated an understanding of the instructions.   The patient was advised to call back or seek an in-person evaluation if the symptoms worsen or if the condition fails to improve as anticipated.  I provided 15 minutes of non-face-to-face time during this encounter.        Review of Systems  Constitutional: Negative for activity change, fatigue and fever.  HENT: Negative for congestion and rhinorrhea.   Respiratory:  Negative for cough and shortness of breath.   Cardiovascular: Negative for chest pain and leg swelling.  Gastrointestinal: Negative for abdominal pain, diarrhea and nausea.  Genitourinary: Negative for dysuria and hematuria.  Neurological: Negative for weakness and headaches.  Psychiatric/Behavioral: Negative for agitation and behavioral problems.       Objective:   Physical Exam  Patient had virtual visit Appears to be in no distress Atraumatic Neuro able to relate and oriented No apparent resp distress Color normal       Assessment & Plan:  1. Elevated transaminase level Patient with slight elevation of transaminase back at his wellness I recommend repeating this lab work in February  2. Hypertriglyceridemia Patient is working hard on diet repeat labs in February  3. Fasting hyperglycemia Patient working hard on diet exercise and losing weight recheck labs in February  4. Insomnia, unspecified type Has insomnia has used Ambien intermittently for years he is aware to always appropriate at least 8 hours to sleep when he takes this medication he states he has never had a trouble with that and typically only takes half a tablet  Patient does have mild dropfoot he states he will be talking with his specialist regarding this possibly needing nerve conduction study or EMG

## 2019-09-28 NOTE — Progress Notes (Signed)
Labs placed in reminder file

## 2019-11-14 ENCOUNTER — Ambulatory Visit (INDEPENDENT_AMBULATORY_CARE_PROVIDER_SITE_OTHER): Payer: 59 | Admitting: Family Medicine

## 2019-11-14 ENCOUNTER — Other Ambulatory Visit: Payer: Self-pay

## 2019-11-14 ENCOUNTER — Ambulatory Visit: Payer: 59 | Attending: Internal Medicine

## 2019-11-14 ENCOUNTER — Encounter: Payer: Self-pay | Admitting: Family Medicine

## 2019-11-14 DIAGNOSIS — Z20822 Contact with and (suspected) exposure to covid-19: Secondary | ICD-10-CM

## 2019-11-14 DIAGNOSIS — Z20828 Contact with and (suspected) exposure to other viral communicable diseases: Secondary | ICD-10-CM | POA: Diagnosis not present

## 2019-11-14 NOTE — Progress Notes (Signed)
   Subjective:  Audio only  Patient ID: Darrell Mckinney, male    DOB: [birth year], 53 y.o.   MRN: [id number]  Fever  This is a new problem. The current episode started yesterday. Associated symptoms include congestion, coughing and muscle aches. Associated symptoms comments: chills.   Worse last night- some better this am   Review of Systems  Constitutional: Positive for fever.  HENT: Positive for congestion.   Respiratory: Positive for cough.    Virtual Visit via Video Note  I connected with Darrell Mckinney on 11/14/19 at 11:00 AM EST by a video enabled telemedicine application and verified that I am speaking with the correct person using two identifiers.  Location: Patient: home Provider: office   I discussed the limitations of evaluation and management by telemedicine and the availability of in person appointments. The patient expressed understanding and agreed to proceed.  History of Present Illness:    Observations/Objective:   Assessment and Plan:   Follow Up Instructions:    I discussed the assessment and treatment plan with the patient. The patient was provided an opportunity to ask questions and all were answered. The patient agreed with the plan and demonstrated an understanding of the instructions.   The patient was advised to call back or seek an in-person evaluation if the symptoms worsen or if the condition fails to improve as anticipated.  I provided 71minutes of non-face-to-face time during this encounter.   Chills fever achey   Feels fine now   Sat was fine  Cough worse yesterday  A little cong  Felt more fatigued sat    Objective:   Physical Exam   Virtual    Assessment & Plan:  Impression flulike illness.  This is either Covid or parainfluenza or something similar.  Extremely little flu present in the [facility] discussed.  Testing strongly encouraged.  Patient will proceed with this.  Quarantine discussed.  Symptom care  discussed warning signs discussed.

## 2019-11-15 ENCOUNTER — Other Ambulatory Visit: Payer: 59

## 2019-11-15 ENCOUNTER — Telehealth: Payer: Self-pay | Admitting: Family Medicine

## 2019-11-15 ENCOUNTER — Other Ambulatory Visit: Payer: Self-pay | Admitting: Family Medicine

## 2019-11-15 LAB — NOVEL CORONAVIRUS, NAA: SARS-CoV-2, NAA: NOT DETECTED

## 2019-11-15 NOTE — Telephone Encounter (Signed)
Strict quarantine, experts say no. High awareness that this could be a false negative and he could carry the virus to someone who may get a lot sicker, yes!

## 2019-11-15 NOTE — Telephone Encounter (Signed)
Discussed with pt that his test was negative. Fever 99 Sunday night and none since. States he is feeling great now. No symptoms. His symptoms lasted about 15 hours and he states he is great now. Can he go back to normal activities. He saw on mychart that the test could be false negative and I explained to him the test was only about 70 percent accurate.

## 2019-11-15 NOTE — Telephone Encounter (Signed)
Pt received results of COVID test on mychart and wants to make sure he is negative.

## 2019-11-15 NOTE — Telephone Encounter (Signed)
Should pt quarantine for 10 days from start of his symptoms

## 2019-11-15 NOTE — Telephone Encounter (Signed)
Discussed with pt and he states he will need some forms filled out for work since he cannot not return yet. He states he will have someone who has not been in contact with him to drop them off tomorrow.

## 2019-11-15 NOTE — Telephone Encounter (Signed)
Once the forms were dropped off please forward them to me thank you

## 2019-11-15 NOTE — Telephone Encounter (Signed)
Tha ti s correct, I specifically remember cautioning him not to proceed like everything is fine if his results are negative, I specifically advised him for instance that it would be a serious mistake to get around older relatives with a neg test because it could be a false neg and serious infxn could occur in someone else

## 2019-11-17 ENCOUNTER — Encounter: Payer: Self-pay | Admitting: Family Medicine

## 2019-11-17 NOTE — Telephone Encounter (Signed)
Pt called, can't get forms to [name] today Requested a letter (he'll access through [facility]) stating that due to Covid related testing he must quarantine for 10 days  Letter done

## 2019-12-15 ENCOUNTER — Other Ambulatory Visit: Payer: Self-pay | Admitting: *Deleted

## 2019-12-15 DIAGNOSIS — R7301 Impaired fasting glucose: Secondary | ICD-10-CM

## 2019-12-15 DIAGNOSIS — R7401 Elevation of levels of liver transaminase levels: Secondary | ICD-10-CM

## 2019-12-15 DIAGNOSIS — E781 Pure hyperglyceridemia: Secondary | ICD-10-CM

## 2019-12-22 ENCOUNTER — Telehealth: Payer: Self-pay | Admitting: *Deleted

## 2019-12-22 NOTE — Telephone Encounter (Signed)
Blood work was ordered in Dentist and patient was notified 12/15/19 and verbalized understanding and stated he would go have the blood work soon.

## 2019-12-22 NOTE — Telephone Encounter (Signed)
-----   Message from Carmelina Noun, LPN sent at [id number] 11:18 AM EDT ----- Regarding: reminder file Reminder file Jan 2021 for A1C,glucose,liver,lipid  Hyperetriglycerides,fasting hyperglycemia,elevated liver enzymes per dr Nicki Reaper.

## 2020-01-05 LAB — HEPATIC FUNCTION PANEL
ALT: 46 IU/L — ABNORMAL HIGH (ref 0–44)
AST: 35 IU/L (ref 0–40)
Albumin: 5.1 g/dL — ABNORMAL HIGH (ref 3.8–4.9)
Alkaline Phosphatase: 62 IU/L (ref 39–117)
Bilirubin Total: 0.5 mg/dL (ref 0.0–1.2)
Bilirubin, Direct: 0.17 mg/dL (ref 0.00–0.40)
Total Protein: 7.1 g/dL (ref 6.0–8.5)

## 2020-01-05 LAB — LIPID PANEL
Chol/HDL Ratio: 3.6 ratio (ref 0.0–5.0)
Cholesterol, Total: 127 mg/dL (ref 100–199)
HDL: 35 mg/dL — ABNORMAL LOW (ref >39)
LDL Chol Calc (NIH): 63 mg/dL (ref 0–99)
Triglycerides: 170 mg/dL — ABNORMAL HIGH (ref 0–149)
VLDL Cholesterol Cal: 29 mg/dL (ref 5–40)

## 2020-01-05 LAB — HEMOGLOBIN A1C
Est. average glucose Bld gHb Est-mCnc: 97 mg/dL
Hgb A1c MFr Bld: 5 % (ref 4.8–5.6)

## 2020-01-05 LAB — GLUCOSE, RANDOM: Glucose: 95 mg/dL (ref 65–99)

## 2020-06-12 ENCOUNTER — Telehealth: Payer: Self-pay | Admitting: Family Medicine

## 2020-06-12 NOTE — Telephone Encounter (Signed)
EKG was forwarded to [name].  It appears normal.  It will be scanned into the system

## 2020-07-26 ENCOUNTER — Telehealth: Payer: Self-pay | Admitting: Family Medicine

## 2020-07-26 DIAGNOSIS — Z125 Encounter for screening for malignant neoplasm of prostate: Secondary | ICD-10-CM

## 2020-07-26 DIAGNOSIS — R7301 Impaired fasting glucose: Secondary | ICD-10-CM

## 2020-07-26 DIAGNOSIS — R7401 Elevation of levels of liver transaminase levels: Secondary | ICD-10-CM

## 2020-07-26 DIAGNOSIS — E781 Pure hyperglyceridemia: Secondary | ICD-10-CM

## 2020-07-26 NOTE — Telephone Encounter (Signed)
Last labs completed on 01/04/20 for glucose random, A1C, hepatic and lipid. Please advise. Thank you

## 2020-07-26 NOTE — Telephone Encounter (Signed)
Pt has a Phy next Friday and needs labs ordered   Pt call back [phone]

## 2020-07-26 NOTE — Telephone Encounter (Signed)
Lipid, liver, metabolic 7, PSA Wellness, screening, fasting hyperglycemia

## 2020-07-27 NOTE — Telephone Encounter (Signed)
Blood work ordered in Epic. Patient notified. 

## 2020-08-03 ENCOUNTER — Encounter: Payer: 59 | Admitting: Family Medicine

## 2020-08-24 ENCOUNTER — Other Ambulatory Visit: Payer: Self-pay | Admitting: *Deleted

## 2020-08-24 ENCOUNTER — Telehealth: Payer: Self-pay

## 2020-08-24 DIAGNOSIS — Z79899 Other long term (current) drug therapy: Secondary | ICD-10-CM

## 2020-08-24 DIAGNOSIS — E7849 Other hyperlipidemia: Secondary | ICD-10-CM

## 2020-08-24 DIAGNOSIS — Z125 Encounter for screening for malignant neoplasm of prostate: Secondary | ICD-10-CM

## 2020-08-24 DIAGNOSIS — E782 Mixed hyperlipidemia: Secondary | ICD-10-CM

## 2020-08-24 DIAGNOSIS — E559 Vitamin D deficiency, unspecified: Secondary | ICD-10-CM

## 2020-08-24 NOTE — Telephone Encounter (Signed)
Patient  has physical on 11/1 and needing labs done including checking his Vitamin D levels also.

## 2020-08-24 NOTE — Telephone Encounter (Signed)
Please add vitamin D level

## 2020-08-24 NOTE — Telephone Encounter (Signed)
Labs were ordered 07/27/20 but patient would like vit d added- please advise

## 2020-09-18 LAB — LIPID PANEL
Chol/HDL Ratio: 3.3 ratio (ref 0.0–5.0)
Cholesterol, Total: 128 mg/dL (ref 100–199)
HDL: 39 mg/dL — ABNORMAL LOW (ref >39)
LDL Chol Calc (NIH): 64 mg/dL (ref 0–99)
Triglycerides: 141 mg/dL (ref 0–149)
VLDL Cholesterol Cal: 25 mg/dL (ref 5–40)

## 2020-09-18 LAB — BASIC METABOLIC PANEL
BUN/Creatinine Ratio: 14 (ref 9–20)
BUN: 15 mg/dL (ref 6–24)
CO2: 24 mmol/L (ref 20–29)
Calcium: 9.5 mg/dL (ref 8.7–10.2)
Chloride: 103 mmol/L (ref 96–106)
Creatinine, Ser: 1.08 mg/dL (ref 0.76–1.27)
GFR calc Af Amer: 90 mL/min/{1.73_m2} (ref >59)
GFR calc non Af Amer: 78 mL/min/{1.73_m2} (ref >59)
Glucose: 111 mg/dL — ABNORMAL HIGH (ref 65–99)
Potassium: 4.4 mmol/L (ref 3.5–5.2)
Sodium: 140 mmol/L (ref 134–144)

## 2020-09-18 LAB — HEPATIC FUNCTION PANEL
ALT: 33 IU/L (ref 0–44)
AST: 25 IU/L (ref 0–40)
Albumin: 4.6 g/dL (ref 3.8–4.9)
Alkaline Phosphatase: 63 IU/L (ref 44–121)
Bilirubin Total: 0.4 mg/dL (ref 0.0–1.2)
Bilirubin, Direct: 0.16 mg/dL (ref 0.00–0.40)
Total Protein: 6.5 g/dL (ref 6.0–8.5)

## 2020-09-18 LAB — PSA: Prostate Specific Ag, Serum: 0.6 ng/mL (ref 0.0–4.0)

## 2020-09-18 LAB — VITAMIN D 25 HYDROXY (VIT D DEFICIENCY, FRACTURES): Vit D, 25-Hydroxy: 50 ng/mL (ref 30.0–100.0)

## 2020-09-24 ENCOUNTER — Other Ambulatory Visit: Payer: Self-pay

## 2020-09-24 ENCOUNTER — Ambulatory Visit (INDEPENDENT_AMBULATORY_CARE_PROVIDER_SITE_OTHER): Payer: 59 | Admitting: Family Medicine

## 2020-09-24 ENCOUNTER — Encounter: Payer: Self-pay | Admitting: Family Medicine

## 2020-09-24 VITALS — BP 120/86 | Temp 97.3°F | Ht 73.0 in | Wt 238.0 lb

## 2020-09-24 DIAGNOSIS — R7301 Impaired fasting glucose: Secondary | ICD-10-CM

## 2020-09-24 DIAGNOSIS — Z23 Encounter for immunization: Secondary | ICD-10-CM

## 2020-09-24 DIAGNOSIS — Z0000 Encounter for general adult medical examination without abnormal findings: Secondary | ICD-10-CM

## 2020-09-24 MED ORDER — ZOLPIDEM TARTRATE 10 MG PO TABS
ORAL_TABLET | ORAL | 1 refills | Status: DC
Start: 2020-09-24 — End: 2022-04-28

## 2020-09-24 NOTE — Progress Notes (Signed)
   Subjective:    Patient ID: Darrell Mckinney, male    DOB: 1966-07-01, 54 y.o.   MRN: [id number] Very nice gentleman Works as an Emergency planning/management officer Recently working hard at [employer] exercise to lose weight Here today for wellness HPI The patient comes in today for a wellness visit.    A review of their health history was completed.  A review of medications was also completed.  Any needed refills; none  Eating habits: trying to good  Falls/  MVA accidents in past few months: none  Regular exercise: yes- 3-5 times a week when home  Specialist pt sees on regular basis: none  Preventative health issues were discussed.   Additional concerns: none    Review of Systems  Constitutional: Negative for diaphoresis and fatigue.  HENT: Negative for congestion and rhinorrhea.   Respiratory: Negative for cough and shortness of breath.   Cardiovascular: Negative for chest pain and leg swelling.  Gastrointestinal: Negative for abdominal pain and diarrhea.  Skin: Negative for color change and rash.  Neurological: Negative for dizziness and headaches.  Psychiatric/Behavioral: Negative for behavioral problems and confusion.       Objective:   Physical Exam Vitals reviewed.  Constitutional:      General: He is not in acute distress. HENT:     Head: Normocephalic and atraumatic.  Eyes:     General:        Right eye: No discharge.        Left eye: No discharge.  Neck:     Trachea: No tracheal deviation.  Cardiovascular:     Rate and Rhythm: Normal rate and regular rhythm.     Heart sounds: Normal heart sounds. No murmur heard.   Pulmonary:     Effort: Pulmonary effort is normal. No respiratory distress.     Breath sounds: Normal breath sounds.  Lymphadenopathy:     Cervical: No cervical adenopathy.  Skin:    General: Skin is warm and dry.  Neurological:     Mental Status: He is alert.     Coordination: Coordination normal.  Psychiatric:        Behavior: Behavior  normal.    Prostate exam is normal Heart no murmurs      Assessment & Plan:  1. Well adult exam Adult wellness-complete.wellness physical was conducted today. Importance of diet and exercise were discussed in detail.  In addition to this a discussion regarding safety was also covered. We also reviewed over immunizations and gave recommendations regarding current immunization needed for age.  In addition to this additional areas were also touched on including: Preventative health exams needed:  Colonoscopy patient states that the GI doctor told him they would connect with him in December to help set this up  Patient was advised yearly wellness exam Labs reviewed with patient  2. Need for vaccination Flu shot today - Flu Vaccine QUAD 36+ mos IM  3. Fasting hyperglycemia Working hard on diet exercise losing weight  Follow-up for checkup in 1 year

## 2020-10-23 ENCOUNTER — Encounter: Payer: Self-pay | Admitting: Internal Medicine

## 2020-11-15 ENCOUNTER — Encounter: Payer: Self-pay | Admitting: Internal Medicine

## 2020-12-10 ENCOUNTER — Ambulatory Visit: Payer: 59

## 2021-01-28 ENCOUNTER — Other Ambulatory Visit: Payer: Self-pay

## 2021-01-28 ENCOUNTER — Ambulatory Visit (INDEPENDENT_AMBULATORY_CARE_PROVIDER_SITE_OTHER): Payer: Self-pay | Admitting: *Deleted

## 2021-01-28 VITALS — Ht 73.0 in | Wt 245.4 lb

## 2021-01-28 DIAGNOSIS — Z1211 Encounter for screening for malignant neoplasm of colon: Secondary | ICD-10-CM

## 2021-01-28 MED ORDER — NA SULFATE-K SULFATE-MG SULF 17.5-3.13-1.6 GM/177ML PO SOLN: 1.0000 | Freq: Once | ORAL | 0 refills | Status: AC

## 2021-01-28 NOTE — Patient Instructions (Addendum)
Darrell Mckinney  04/16/1966 MRN: [id number]    Covid test: 03/19/2021 at 1:30 [facility] location per pt request)   Procedure Date: 03/21/2021 Time to register: You will receive a call from the hospital a few days before your procedure. Place to register: [facility] Scheduled provider: Dr. Gala Romney    PREPARATION FOR COLONOSCOPY WITH SUPREP BOWEL PREP KIT  Note: Suprep Bowel Prep Kit is a split-dose (2day) regimen. Consumption of BOTH 6-ounce bottles is required for a complete prep.  Please notify us immediately if you are diabetic, take iron supplements, or if you are on Coumadin or any other blood thinners.  Please hold the following medications: n/a                                                                                                                                                  2 DAYS BEFORE PROCEDURE:  DATE:  03/19/2021  DAY: Tuesday Begin clear liquid diet AFTER your lunch meal. NO SOLID FOODS after this point.  1 DAY BEFORE PROCEDURE:  DATE: 03/20/2021   DAY:  Wednesday Continue clear liquids the entire day - NO SOLID FOOD.   Diabetic medications adjustments for today:   At 8:00am: Complete steps 1 through 4 below, using ONE (1) 6-ounce bottle, before going to bed. Step 1:  Pour ONE (1) 6-ounce bottle of SUPREP liquid into the mixing container.  Step 2:  Add cool drinking water to the 16 ounce line on the container and mix.  Note: Dilute the solution concentrate as directed prior to use. Step 3:  DRINK ALL the liquid in the container. Step 4:  You MUST drink an additional two (2) or more 16 ounce containers of water over the next one (1) hour.    At 6:00pm: Complete steps 1 through 4 below, using ONE (1) 6-ounce bottle, before going to bed. Step 1:  Pour ONE (1) 6-ounce bottle of SUPREP liquid into the mixing container.  Step 2:  Add cool drinking water to the 16 ounce line on the container and mix.  Note: Dilute the solution concentrate as directed  prior to use. Step 3:  DRINK ALL the liquid in the container. Step 4:  You MUST drink an additional two (2) or more 16 ounce containers of water over the next one (1) hour.   Continue clear liquids until midnight.  Nothing by mouth after midnight.  DAY OF PROCEDURE:   DATE: 03/21/2021   DAY: Thursday If you take medications for your heart, blood pressure, or breathing, you may take these medications.  Diabetic medications adjustments for today:  You may take your morning medications with sip of water unless we have instructed otherwise.    Please see below for Dietary Information.  CLEAR LIQUIDS INCLUDE:  Water Jello (NOT red in color)   Ice Popsicles (NOT red in  color)   Tea (sugar ok, no milk/cream) Powdered fruit flavored drinks  Coffee (sugar ok, no milk/cream) Gatorade/ Lemonade/ Kool-Aid  (NOT red in color)   Juice: apple, white grape, white cranberry Soft drinks  Clear bullion, consomme, broth (fat free beef/chicken/vegetable)  Carbonated beverages (any kind)  Strained chicken noodle soup Hard Candy   Remember: Clear liquids are liquids that will allow you to see your fingers on the other side of a clear glass. Be sure liquids are NOT red in color, and not cloudy, but CLEAR.  DO NOT EAT OR DRINK ANY OF THE FOLLOWING:  Dairy products of any kind   Cranberry juice Tomato juice / V8 juice   Grapefruit juice Orange juice     Red grape juice  Do not eat any solid foods, including such foods as: cereal, oatmeal, yogurt, fruits, vegetables, creamed soups, eggs, bread, crackers, pureed foods in a blender, etc.   HELPFUL HINTS FOR DRINKING PREP SOLUTION:   Make sure prep is extremely cold. Mix and refrigerate the the morning of the prep. You may also put in the freezer.   You may try mixing some Crystal Light or Country Time Lemonade if you prefer. Mix in small amounts; add more if necessary.  Try drinking through a straw  Rinse mouth with water or a mouthwash between glasses,  to remove after-taste.  Try sipping on a cold beverage /ice/ popsicles between glasses of prep.  Place a piece of sugar-free hard candy in mouth between glasses.  If you become nauseated, try consuming smaller amounts, or stretch out the time between glasses. Stop for 30-60 minutes, then slowly start back drinking.     OTHER INSTRUCTIONS  You will need a responsible adult at least 54 years of age to accompany you and drive you home. This person must remain in the waiting room during your procedure. The hospital will cancel your procedure if you do not have a responsible adult with you.   1. Wear loose fitting clothing that is easily removed. 2. Leave jewelry and other valuables at home.  3. Remove all body piercing jewelry and leave at home. 4. Total time from sign-in until discharge is approximately 2-3 hours. 5. You should go home directly after your procedure and rest. You can resume normal activities the day after your procedure. 6. The day of your procedure you should not:  Drive  Make legal decisions  Operate machinery  Drink alcohol  Return to work   You may call the office (Dept: [phone]) before 5:00pm, or page the doctor on call [phone]) after 5:00pm, for further instructions, if necessary.   Insurance Information YOU WILL NEED TO CHECK WITH YOUR INSURANCE COMPANY FOR THE BENEFITS OF COVERAGE YOU HAVE FOR THIS PROCEDURE.  UNFORTUNATELY, NOT ALL INSURANCE COMPANIES HAVE BENEFITS TO COVER ALL OR PART OF THESE TYPES OF PROCEDURES.  IT IS YOUR RESPONSIBILITY TO CHECK YOUR BENEFITS, HOWEVER, WE WILL BE GLAD TO ASSIST YOU WITH ANY CODES YOUR INSURANCE COMPANY MAY NEED.    PLEASE NOTE THAT MOST INSURANCE COMPANIES WILL NOT COVER A SCREENING COLONOSCOPY FOR PEOPLE UNDER THE AGE OF 50  IF YOU HAVE BCBS INSURANCE, YOU MAY HAVE BENEFITS FOR A SCREENING COLONOSCOPY BUT IF POLYPS ARE FOUND THE DIAGNOSIS WILL CHANGE AND THEN YOU MAY HAVE A DEDUCTIBLE THAT WILL NEED TO BE  MET. SO PLEASE MAKE SURE YOU CHECK YOUR BENEFITS FOR A SCREENING COLONOSCOPY AS WELL AS A DIAGNOSTIC COLONOSCOPY.

## 2021-01-28 NOTE — Progress Notes (Addendum)
Gastroenterology Pre-Procedure Review  Request Date: 01/28/2021 Requesting Physician: 5 year recall, Last TCS done 10/26/2015 by Dr. Gala Romney, normal colon, family hx of colon cancer (father)  PATIENT REVIEW QUESTIONS: The patient responded to the following health history questions as indicated:    1. Diabetes Melitis: no 2. Joint replacements in the past 12 months: no 3. Major health problems in the past 3 months: no 4. Has an artificial valve or MVP: no 5. Has a defibrillator: no 6. Has been advised in past to take antibiotics in advance of a procedure like teeth cleaning: no 7. Family history of colon cancer: yes, father: age 55  8. Alcohol Use: yes, 2 to 3 beers a day 9. Illicit drug Use: no 10. History of sleep apnea: no  11. History of coronary artery or other vascular stents placed within the last 12 months: no 12. History of any prior anesthesia complications: no 13. Body mass index is 32.38 kg/m.    MEDICATIONS & ALLERGIES:    Patient reports the following regarding taking any blood thinners:   Plavix? no Aspirin? no Coumadin? no Brilinta? no Xarelto? no Eliquis? no Pradaxa? no Savaysa? no Effient? no  Patient confirms/reports the following medications:  Current Outpatient Medications  Medication Sig Dispense Refill  . B Complex Vitamins (VITAMIN B COMPLEX PO) Take 1 tablet by mouth daily.    . cholecalciferol (VITAMIN D3) 25 MCG (1000 UNIT) tablet Take 1,000 Units by mouth daily.    Marland Kitchen ibuprofen (ADVIL,MOTRIN) 800 MG tablet Take 800 mg by mouth as needed.    . montelukast (SINGULAIR) 10 MG tablet Take 10 mg by mouth daily. Seasonal usage    . vitamin C (ASCORBIC ACID) 500 MG tablet Take 500 mg by mouth daily.    Marland Kitchen zolpidem (AMBIEN) 10 MG tablet 1/2 to 1 qhs prn insomnia (Patient taking differently: as needed. 1/2 to 1 qhs prn insomnia) 90 tablet 1   No current facility-administered medications for this visit.    Patient confirms/reports the following allergies:  No  Known Allergies  No orders of the defined types were placed in this encounter.   AUTHORIZATION INFORMATION Primary Insurance: [employer],  [place] #:[id number] ,  Group #: [id number] Pre-Cert / Josem Kaufmann required: Yes, approved online 07/21/33-08/10/9149 Pre-Cert / Josem Kaufmann #: [id number]  SCHEDULE INFORMATION: Procedure has been scheduled as follows:  Date: 03/21/2021, Time: AM procedure Location: [facility] with Dr. Gala Romney  This Gastroenterology Pre-Precedure Review Form is being routed to the following provider(s): Aliene Altes, PA

## 2021-01-28 NOTE — Progress Notes (Signed)
Patient would likely benefit from propofol for sedation considering daily alcohol use, Ambien for insomnia, and increased sedation requirements for last procedure. Will need OV to arrange.

## 2021-01-29 ENCOUNTER — Telehealth: Payer: Self-pay | Admitting: *Deleted

## 2021-01-29 ENCOUNTER — Encounter: Payer: Self-pay | Admitting: *Deleted

## 2021-01-29 NOTE — Telephone Encounter (Signed)
Mailed letter to pt informing him to contact our office to schedule an appointment with one of our providers.  OV needed with provider to arrange TCS.

## 2021-01-29 NOTE — Telephone Encounter (Signed)
Lmom for pt to call me back.  Pt needs ov to arrange TCS.

## 2021-01-30 ENCOUNTER — Encounter: Payer: Self-pay | Admitting: *Deleted

## 2021-01-30 NOTE — Telephone Encounter (Addendum)
Pt called me back.  Informed him that he would need an ov to arrange TCS due to medications, alcohol usage, and increased sedation last visit.  Pt said that this did not make any sense to him.  He said that we should be able to schedule TCS without him coming in.  I explained to him that this was our office policy and that we would be using a different type of sedation and that we would be discussing this with him.  He said "Again, this makes no sense.  We should just schedule it with the sedation needed.  There shouldn't be a need for an office visit."  He asked if it could be done by telephone.  I explained that it had been over a year since we last seen him and that an office visit was required.  He said that it shouldn't be because there was no need for him to come in here.  He said that he had been perfectly fine for the past 5 years and nothing had changed with his health.  He then told me that he would call me back in the next month to schedule an office visit or he would wait another 5 years.  Scheduled pt for a virtual visit 02/13/2021 at 10:00 with Neil Crouch, PA.  Routing to [employer], [place] and Dr. Gala Romney as Juluis Rainier.

## 2021-01-30 NOTE — Progress Notes (Signed)
Spoke with pt and informed him of Kristen Harper's recommendations.  Pt said that he would have to call us back to schedule ov.

## 2021-01-30 NOTE — Progress Notes (Signed)
Spoke with pt again and offered virtual visit for 02/13/2021 at 10:00 with Neil Crouch, PA.

## 2021-01-30 NOTE — Telephone Encounter (Signed)
Noted. Agree with recommendations. As this is our current policy, he will need to keep scheduled OV to arrange procedure.

## 2021-02-01 NOTE — Telephone Encounter (Signed)
I have reviewed the record here.  Patient is in good health otherwise.  He is an Emergency planning/management officer.  Think we can just go ahead and schedule him for the procedure straightaway.  We can forego the office visit.  However, he will need propofol.  ASA 2.

## 2021-02-01 NOTE — Telephone Encounter (Signed)
Spoke with pt and informed him that Dr. Gala Romney agreed to doing procedure with Propofol without requiring an ov since he is in good health.  Pt aware that virtual visit has been cancelled.  Pt informed me that he did not have his schedule for April available yet.  He requested to call me back once he has it.  Pt aware that a Covid screening is required 2 days before and he would need to quarantine.  Pt voiced understanding.

## 2021-02-06 ENCOUNTER — Encounter: Payer: Self-pay | Admitting: *Deleted

## 2021-02-06 ENCOUNTER — Other Ambulatory Visit: Payer: Self-pay | Admitting: *Deleted

## 2021-02-06 DIAGNOSIS — Z1211 Encounter for screening for malignant neoplasm of colon: Secondary | ICD-10-CM

## 2021-02-06 NOTE — Telephone Encounter (Signed)
Spoke to pt.  He scheduled Covid test for 03/19/2021 at 3:45 and procedure for 03/21/2021 AM procedure.  Pt made aware that Day Surgery will contact him regarding the time.  Pt requested all prep information to be sent to him in Mychart.  Sent accordingly and informed him to call me if any problems.  Pt voiced understanding.

## 2021-02-12 ENCOUNTER — Encounter: Payer: Self-pay | Admitting: *Deleted

## 2021-02-12 ENCOUNTER — Telehealth: Payer: Self-pay | Admitting: *Deleted

## 2021-02-12 DIAGNOSIS — Z1211 Encounter for screening for malignant neoplasm of colon: Secondary | ICD-10-CM

## 2021-02-12 NOTE — Telephone Encounter (Signed)
Please send a referral to our office for procedure thank you

## 2021-02-12 NOTE — Telephone Encounter (Signed)
Referral sent 

## 2021-02-12 NOTE — Telephone Encounter (Signed)
Received a voice mail  from [place] at [facility].  She informed me that pt's scheduled procedure had been denied authorization for our facility.  She stated on my voice mail that it needed to be done at an ambulatory surgical center in order to be covered since he didn't have any medical issues.  Ref#: [id number]  Left a voice mail for pt to call me back.  Sent him a Pharmacist, community message to call as well.

## 2021-02-12 NOTE — Telephone Encounter (Signed)
Spoke to pt and he was made aware of the information given to me by [facility].  He informed me that Dr. Gala Romney and [facility] were listed on the web site as being covered.   I called [employer] and gave them the information that pt passed along to me about [name] being listed on their web site.  15 T at [facility] informed me that the authorization was denied because it is recommended that pt have the procedure at an ambulatory surgical center.  She did mention doing a peer to peer with Dr. Gala Romney.  Ref#: [id number].  Discussed it with Dr. Gala Romney and he advised pt to have it done at an ambulatory surgical center so it would be covered and more cost effective for him.  He did recommend [facility] (Dr. Ardis Hughs).  Sent pt a Pharmacist, community message with their phone number [phone].  I am going to pass this information along to their office so that they are aware and it should help pt as well.

## 2021-02-12 NOTE — Telephone Encounter (Signed)
Can you please send referral to Dr. Ardis Hughs at [facility].  Please see below.

## 2021-02-12 NOTE — Addendum Note (Signed)
Addended by: Cheron Every on: 02/12/2021 04:41 PM   Modules accepted: Orders

## 2021-02-12 NOTE — Telephone Encounter (Signed)
We'll be happy to see him for 'direct' colonsocopy.  Looks like his father had colon cancer in his 75s, his last colonoscopy was 5-6 years ago.

## 2021-02-13 ENCOUNTER — Telehealth: Payer: 59 | Admitting: Gastroenterology

## 2021-02-15 ENCOUNTER — Telehealth: Payer: Self-pay | Admitting: *Deleted

## 2021-02-15 NOTE — Telephone Encounter (Signed)
Called pt to schedule procedure. He stated to have gotten a letter from his insurance with news that  They will cover his procedure with Dr. Gala Romney. Referral has been closed.

## 2021-02-15 NOTE — Telephone Encounter (Signed)
Spoke with Darrell Mckinney.  He is aware that we received an approval from [facility] as well.  Pt is going to call [facility] to make sure that it will be covered and what the expense would be for him.

## 2021-02-15 NOTE — Telephone Encounter (Signed)
Hi Patty, pt called to schedule direct colon. I tried to assist him with scheduling, However, he has specific available dates due to his work schedule. His availability is only from April 25 to April 29. He is also requesting if he we can skip pre-visit since he already went through all that at Dr. Roseanne Kaufman office. I told him that I would send you his requests to see if you could better assist him. Thank you.

## 2021-02-15 NOTE — Telephone Encounter (Signed)
Offer him 4/26 in the [place] with Dr Ardis Hughs.  He will have to have previsit to sign the consent form.

## 2021-02-18 ENCOUNTER — Other Ambulatory Visit: Payer: Self-pay | Admitting: *Deleted

## 2021-03-18 ENCOUNTER — Other Ambulatory Visit: Payer: Self-pay | Admitting: *Deleted

## 2021-03-19 ENCOUNTER — Other Ambulatory Visit (HOSPITAL_COMMUNITY)
Admission: RE | Admit: 2021-03-19 | Discharge: 2021-03-19 | Disposition: A | Discharge status: Home / Self Care | Payer: 59 | Source: Ambulatory Visit | Attending: Internal Medicine | Admitting: Internal Medicine

## 2021-03-19 ENCOUNTER — Other Ambulatory Visit (HOSPITAL_COMMUNITY): Payer: 59

## 2021-03-19 DIAGNOSIS — Z20822 Contact with and (suspected) exposure to covid-19: Secondary | ICD-10-CM | POA: Insufficient documentation

## 2021-03-19 DIAGNOSIS — Z01812 Encounter for preprocedural laboratory examination: Secondary | ICD-10-CM | POA: Insufficient documentation

## 2021-03-20 LAB — SARS CORONAVIRUS 2 (TAT 6-24 HRS): SARS Coronavirus 2: NEGATIVE

## 2021-03-21 ENCOUNTER — Encounter (HOSPITAL_COMMUNITY)
Admission: RE | Disposition: A | Discharge status: Home / Self Care | Payer: Self-pay | Source: Home / Self Care | Attending: Internal Medicine

## 2021-03-21 ENCOUNTER — Encounter (HOSPITAL_COMMUNITY): Payer: Self-pay | Admitting: Internal Medicine

## 2021-03-21 ENCOUNTER — Ambulatory Visit (HOSPITAL_COMMUNITY)
Admission: RE | Admit: 2021-03-21 | Discharge: 2021-03-21 | Disposition: A | Discharge status: Home / Self Care | Payer: 59 | Attending: Internal Medicine | Admitting: Internal Medicine

## 2021-03-21 ENCOUNTER — Other Ambulatory Visit: Payer: Self-pay

## 2021-03-21 ENCOUNTER — Ambulatory Visit (HOSPITAL_COMMUNITY): Payer: 59 | Admitting: Anesthesiology

## 2021-03-21 DIAGNOSIS — Z1211 Encounter for screening for malignant neoplasm of colon: Secondary | ICD-10-CM | POA: Diagnosis not present

## 2021-03-21 DIAGNOSIS — Z79899 Other long term (current) drug therapy: Secondary | ICD-10-CM | POA: Insufficient documentation

## 2021-03-21 DIAGNOSIS — Z800 Family history of malignant neoplasm of digestive organs: Secondary | ICD-10-CM

## 2021-03-21 DIAGNOSIS — D120 Benign neoplasm of cecum: Secondary | ICD-10-CM | POA: Diagnosis not present

## 2021-03-21 DIAGNOSIS — K635 Polyp of colon: Secondary | ICD-10-CM | POA: Insufficient documentation

## 2021-03-21 DIAGNOSIS — D125 Benign neoplasm of sigmoid colon: Secondary | ICD-10-CM

## 2021-03-21 DIAGNOSIS — Z85828 Personal history of other malignant neoplasm of skin: Secondary | ICD-10-CM | POA: Diagnosis not present

## 2021-03-21 HISTORY — PX: POLYPECTOMY: SHX149

## 2021-03-21 HISTORY — PX: COLONOSCOPY WITH PROPOFOL: SHX5780

## 2021-03-21 SURGERY — COLONOSCOPY WITH PROPOFOL
Anesthesia: General

## 2021-03-21 MED ORDER — STERILE WATER FOR IRRIGATION IR SOLN
Status: DC | PRN
  Administered 2021-03-21: 200 mL

## 2021-03-21 MED ORDER — LACTATED RINGERS IV SOLN
INTRAVENOUS | Status: DC
  Administered 2021-03-21: 1000 mL via INTRAVENOUS

## 2021-03-21 MED ORDER — CHLORHEXIDINE GLUCONATE CLOTH 2 % EX PADS: 6.0000 | MEDICATED_PAD | Freq: Once | CUTANEOUS | Status: DC

## 2021-03-21 MED ORDER — CHLORHEXIDINE GLUCONATE CLOTH 2 % EX PADS
6.0000 | MEDICATED_PAD | Freq: Once | CUTANEOUS | Status: DC
Start: 2021-03-21 — End: 2021-03-21

## 2021-03-21 MED ORDER — LIDOCAINE HCL (CARDIAC) PF 100 MG/5ML IV SOSY
PREFILLED_SYRINGE | INTRAVENOUS | Status: DC | PRN
  Administered 2021-03-21: 50 mg via INTRAVENOUS

## 2021-03-21 MED ORDER — PROPOFOL 10 MG/ML IV BOLUS
INTRAVENOUS | Status: DC | PRN
  Administered 2021-03-21: 150 mg via INTRAVENOUS
  Administered 2021-03-21 (×2): 50 mg via INTRAVENOUS

## 2021-03-21 MED ORDER — PROPOFOL 500 MG/50ML IV EMUL: INTRAVENOUS | Status: DC | PRN

## 2021-03-21 NOTE — Transfer of Care (Signed)
Immediate Anesthesia Transfer of Care Note  Patient: Darrell Mckinney  Procedure(s) Performed: COLONOSCOPY WITH PROPOFOL (N/A ) POLYPECTOMY INTESTINAL  Patient Location: Endoscopy Unit  Anesthesia Type:General  Level of Consciousness: awake, alert  and oriented  Airway & Oxygen Therapy: Patient Spontanous Breathing  Post-op Assessment: Report given to RN and Post -op Vital signs reviewed and stable  Post vital signs: Reviewed and stable  Last Vitals:  Vitals Value Taken Time  BP 110/58 03/21/21 1120  Temp 36.8 C 03/21/21 1120  Pulse    Resp 18 03/21/21 1120  SpO2 99 % 03/21/21 1120    Last Pain:  Vitals:   03/21/21 1120  TempSrc: Oral  PainSc: 0-No pain      Patients Stated Pain Goal: 6 (48/88/91 6945)  Complications: No complications documented.

## 2021-03-21 NOTE — H&P (Signed)
@LOGO @   Primary Care Physician:  Kathyrn Drown, MD Primary Gastroenterologist:  Dr. Gala Romney  Pre-Procedure History & Physical: HPI:  Darrell Mckinney is a 55 y.o. male is here for a screening colonoscopy.  Negative colonoscopy about 5 years ago.  Positive family history colon cancer in patient's father.  No bowel symptoms.  Past Medical History:  Diagnosis Date  . Acid reflux   . Back pain   . Bulging lumbar disc   . Fatigue   . GERD (gastroesophageal reflux disease)   . Hay fever   . History of basal cell cancer 12/09/2018   Removed December 2020 left inner eye skin fold  . Joint pain   . Muscle pain   . Sciatic pain   . Seasonal allergies     Past Surgical History:  Procedure Laterality Date  . BACK SURGERY  ~2006  . COLONOSCOPY  12/2008   RMR: normal  . COLONOSCOPY N/A 10/26/2015   Procedure: COLONOSCOPY;  Surgeon: Daneil Dolin, MD;  Location: [facility];  Service: Endoscopy;  Laterality: N/A;  11:15 - moved to 12:00 - pt knows to arrive at 11:00  . TURBINATE RESECTION Bilateral 01/25/2013   Procedure: BILATERAL TURBINATE RESECTION;  Surgeon: Ascencion Dike, MD;  Location: [place];  Service: ENT;  Laterality: Bilateral;    Prior to Admission medications   Medication Sig Start Date End Date Taking? Authorizing Provider  Ascorbic Acid (VITAMIN C) 1000 MG tablet Take 1,000 mg by mouth daily.   Yes [provider]  B Complex Vitamins (VITAMIN B COMPLEX PO) Take 1 tablet by mouth daily.   Yes [provider]  Cholecalciferol (VITAMIN D) 50 MCG (2000 UT) CAPS Take 2,000 Units by mouth daily.   Yes [provider]  EPINEPHrine 0.3 mg/0.3 mL IJ SOAJ injection Inject 0.3 mg into the skin daily as needed.   Yes [provider]  ibuprofen (ADVIL,MOTRIN) 800 MG tablet Take 800 mg by mouth as needed.   Yes [provider]  levocetirizine (XYZAL) 5 MG tablet Take 5 mg by mouth daily. 10/25/20  Yes [provider]   montelukast (SINGULAIR) 10 MG tablet Take 10 mg by mouth daily. Seasonal usage   Yes [provider]  pantoprazole (PROTONIX) 40 MG tablet Take 40 mg by mouth daily.   Yes [provider]  zolpidem (AMBIEN) 10 MG tablet 1/2 to 1 qhs prn insomnia Patient taking differently: Take 10 mg by mouth at bedtime as needed for sleep. 09/24/20  Yes Kathyrn Drown, MD    Allergies as of 03/18/2021  . (No Known Allergies)    Family History  Problem Relation Age of Onset  . Colon cancer Father        dx age 19, deceased age 88  . Colon cancer Paternal Uncle        age 14s  . Stroke Mother     Social History   Socioeconomic History  . Marital status: Married    Spouse name: Lattie Haw  . Number of children: 2  . Years of education: Not on file  . Highest education level: Not on file  Occupational History  . Occupation: Arts development officer  Tobacco Use  . Smoking status: Never Smoker  . Smokeless tobacco: Never Used  Substance and Sexual Activity  . Alcohol use: Yes    Alcohol/week: 0.0 standard drinks    Comment: six pack per week  . Drug use: No  . Sexual activity:  Not on file  Other Topics Concern  . Not on file  Social History Narrative  . Not on file   Social Determinants of Health   Financial Resource Strain: Not on file  Food Insecurity: Not on file  Transportation Needs: Not on file  Physical Activity: Not on file  Stress: Not on file  Social Connections: Not on file  Intimate Partner Violence: Not on file    Review of Systems: See HPI, otherwise negative ROS  Physical Exam: BP 127/70   Pulse (!) 58   Temp 98.3 F (36.8 C) (Oral)   Resp (!) 24   Ht 6\' 1"  (1.854 m)   Wt 107.5 kg   SpO2 97%   BMI 31.27 kg/m  General:   Alert,  Well-developed, well-nourished, pleasant and cooperative in NAD Mouth:  No deformity or lesions, dentition normal. Neck:  Supple; no masses or thyromegaly. Lungs:  Clear throughout to auscultation.   No wheezes,  crackles, or rhonchi. No acute distress. Heart:  Regular rate and rhythm; no murmurs, clicks, rubs,  or gallops. Abdomen:  Soft, nontender and nondistended. No masses, hepatosplenomegaly or hernias noted. Normal bowel sounds, without guarding, and without rebound.    Impression/Plan: Darrell Mckinney is now here to undergo a screening colonoscopy.  High risk examination.  Risks, benefits, limitations, imponderables and alternatives regarding colonoscopy have been reviewed with the patient. Questions have been answered. All parties agreeable.     Notice:  This dictation was prepared with Dragon dictation along with smaller phrase technology. Any transcriptional errors that result from this process are unintentional and may not be corrected upon review.

## 2021-03-21 NOTE — Anesthesia Postprocedure Evaluation (Signed)
Anesthesia Post Note  Patient: Darrell Mckinney  Procedure(s) Performed: COLONOSCOPY WITH PROPOFOL (N/A ) POLYPECTOMY INTESTINAL  Patient location during evaluation: Endoscopy Anesthesia Type: General Level of consciousness: awake and alert and oriented Pain management: pain level controlled Vital Signs Assessment: post-procedure vital signs reviewed and stable Respiratory status: spontaneous breathing and respiratory function stable Cardiovascular status: blood pressure returned to baseline and stable Postop Assessment: no apparent nausea or vomiting Anesthetic complications: no   No complications documented.   Last Vitals:  Vitals:   03/21/21 1014 03/21/21 1120  BP: 127/70 (!) 110/58  Pulse: (!) 58   Resp: (!) 24 18  Temp: 36.8 C 36.8 C  SpO2: 97% 99%    Last Pain:  Vitals:   03/21/21 1120  TempSrc: Oral  PainSc: 0-No pain                 Jamacia Jester C Daunte Oestreich

## 2021-03-21 NOTE — Anesthesia Preprocedure Evaluation (Signed)
Anesthesia Evaluation  Patient identified by MRN, date of birth, ID band Patient awake    Reviewed: Allergy & Precautions, NPO status , Patient's Chart, lab work & pertinent test results  Airway Mallampati: II  TM Distance: >3 FB Neck ROM: Full    Dental  (+) Dental Advisory Given, Caps   Pulmonary    Pulmonary exam normal breath sounds clear to auscultation       Cardiovascular Exercise Tolerance: Good negative cardio ROS Normal cardiovascular exam Rhythm:Regular Rate:Normal     Neuro/Psych  Neuromuscular disease negative psych ROS   GI/Hepatic Neg liver ROS, GERD  Medicated,  Endo/Other  negative endocrine ROS  Renal/GU negative Renal ROS     Musculoskeletal negative musculoskeletal ROS (+)   Abdominal   Peds  Hematology negative hematology ROS (+)   Anesthesia Other Findings   Reproductive/Obstetrics negative OB ROS                             Anesthesia Physical Anesthesia Plan  ASA: II  Anesthesia Plan: General   Post-op Pain Management:    Induction: Intravenous  PONV Risk Score and Plan: Propofol infusion  Airway Management Planned: Nasal Cannula and Natural Airway  Additional Equipment:   Intra-op Plan:   Post-operative Plan:   Informed Consent: I have reviewed the patients History and Physical, chart, labs and discussed the procedure including the risks, benefits and alternatives for the proposed anesthesia with the patient or authorized representative who has indicated his/her understanding and acceptance.     Dental advisory given  Plan Discussed with: CRNA and Surgeon  Anesthesia Plan Comments:         Anesthesia Quick Evaluation

## 2021-03-21 NOTE — Discharge Instructions (Signed)
Colonoscopy Discharge Instructions  Read the instructions outlined below and refer to this sheet in the next few weeks. These discharge instructions provide you with general information on caring for yourself after you leave the hospital. Your doctor may also give you specific instructions. While your treatment has been planned according to the most current medical practices available, unavoidable complications occasionally occur. If you have any problems or questions after discharge, call Dr. Gala Romney at [phone]. ACTIVITY  You may resume your regular activity, but move at a slower pace for the next 24 hours.   Take frequent rest periods for the next 24 hours.   Walking will help get rid of the air and reduce the bloated feeling in your belly (abdomen).   No driving for 24 hours (because of the medicine (anesthesia) used during the test).    Do not sign any important legal documents or operate any machinery for 24 hours (because of the anesthesia used during the test).  NUTRITION  Drink plenty of fluids.   You may resume your normal diet as instructed by your doctor.   Begin with a light meal and progress to your normal diet. Heavy or fried foods are harder to digest and may make you feel sick to your stomach (nauseated).   Avoid alcoholic beverages for 24 hours or as instructed.  MEDICATIONS  You may resume your normal medications unless your doctor tells you otherwise.  WHAT YOU CAN EXPECT TODAY  Some feelings of bloating in the abdomen.   Passage of more gas than usual.   Spotting of blood in your stool or on the toilet paper.  IF YOU HAD POLYPS REMOVED DURING THE COLONOSCOPY:  No aspirin products for 7 days or as instructed.   No alcohol for 7 days or as instructed.   Eat a soft diet for the next 24 hours.  FINDING OUT THE RESULTS OF YOUR TEST Not all test results are available during your visit. If your test results are not back during the visit, make an appointment  with your caregiver to find out the results. Do not assume everything is normal if you have not heard from your caregiver or the medical facility. It is important for you to follow up on all of your test results.  SEEK IMMEDIATE MEDICAL ATTENTION IF:  You have more than a spotting of blood in your stool.   Your belly is swollen (abdominal distention).   You are nauseated or vomiting.   You have a temperature over 101.   You have abdominal pain or discomfort that is severe or gets worse throughout the day.    2 small polyps removed in your colon today  Further recommendations to follow pending review of pathology report  Patient request, I called Gavriel Holzhauer at [phone] reviewed findings and recommendations   Colon Polyps  Colon polyps are tissue growths inside the colon, which is part of the large intestine. They are one of the types of polyps that can grow in the body. A polyp may be a round bump or a mushroom-shaped growth. You could have one polyp or more than one. Most colon polyps are noncancerous (benign). However, some colon polyps can become cancerous over time. Finding and removing the polyps early can help prevent this. What are the causes? The exact cause of colon polyps is not known. What increases the risk? The following factors may make you more likely to develop this condition:  Having a family history of colorectal cancer or colon polyps.  Being older than 54 years of age.  Being younger than 54 years of age and having a significant family history of colorectal cancer or colon polyps or a genetic condition that puts you at higher risk of getting colon polyps.  Having inflammatory bowel disease, such as ulcerative colitis or Crohn's disease.  Having certain conditions passed from parent to child (hereditary conditions), such as: ? Familial adenomatous polyposis (FAP). ? Lynch syndrome. ? Turcot syndrome. ? Peutz-Jeghers syndrome. ? MUTYH-associated  polyposis (MAP).  Being overweight.  Certain lifestyle factors. These include smoking cigarettes, drinking too much alcohol, not getting enough exercise, and eating a diet that is high in fat and red meat and low in fiber.  Having had childhood cancer that was treated with radiation of the abdomen. What are the signs or symptoms? Many times, there are no symptoms. If you have symptoms, they may include:  Blood coming from the rectum during a bowel movement.  Blood in the stool (feces). The blood may be bright red or very dark in color.  Pain in the abdomen.  A change in bowel habits, such as constipation or diarrhea. How is this diagnosed? This condition is diagnosed with a colonoscopy. This is a procedure in which a lighted, flexible scope is inserted into the opening between the buttocks (anus) and then passed into the colon to examine the area. Polyps are sometimes found when a colonoscopy is done as part of routine cancer screening tests. How is this treated? This condition is treated by removing any polyps that are found. Most polyps can be removed during a colonoscopy. Those polyps will then be tested for cancer. Additional treatment may be needed depending on the results of testing. Follow these instructions at home: Eating and drinking  Eat foods that are high in fiber, such as fruits, vegetables, and whole grains.  Eat foods that are high in calcium and vitamin D, such as milk, cheese, yogurt, eggs, liver, fish, and broccoli.  Limit foods that are high in fat, such as fried foods and desserts.  Limit the amount of red meat, precooked or cured meat, or other processed meat that you eat, such as hot dogs, sausages, bacon, or meat loaves.  Limit sugary drinks.   Lifestyle  Maintain a healthy weight, or lose weight if recommended by your health care provider.  Exercise every day or as told by your health care provider.  Do not use any products that contain nicotine or  tobacco, such as cigarettes, e-cigarettes, and chewing tobacco. If you need help quitting, ask your health care provider.  Do not drink alcohol if: ? Your health care provider tells you not to drink. ? You are pregnant, may be pregnant, or are planning to become pregnant.  If you drink alcohol: ? Limit how much you use to:  0-1 drink a day for women.  0-2 drinks a day for men. ? Know how much alcohol is in your drink. In the [place] one drink equals one 12 oz bottle of beer (355 mL), one 5 oz glass of wine (148 mL), or one 1 oz glass of hard liquor (44 mL). General instructions  Take over-the-counter and prescription medicines only as told by your health care provider.  Keep all follow-up visits. This is important. This includes having regularly scheduled colonoscopies. Talk to your health care provider about when you need a colonoscopy. Contact a health care provider if:  You have new or worsening bleeding during a bowel movement.  You have new  or increased blood in your stool.  You have a change in bowel habits.  You lose weight for no known reason. Summary  Colon polyps are tissue growths inside the colon, which is part of the large intestine. They are one type of polyp that can grow in the body.  Most colon polyps are noncancerous (benign), but some can become cancerous over time.  This condition is diagnosed with a colonoscopy.  This condition is treated by removing any polyps that are found. Most polyps can be removed during a colonoscopy. This information is not intended to replace advice given to you by your health care provider. Make sure you discuss any questions you have with your health care provider. Document Revised: 02/29/2020 Document Reviewed: 02/29/2020 Elsevier Patient Education  2021 [employer].

## 2021-03-21 NOTE — Op Note (Signed)
[facility] Patient Name: Darrell Mckinney Procedure Date: 03/21/2021 10:31 AM MRN: [id number] Date of Birth: May 21, 1966 Attending MD: Norvel Richards , MD CSN: [id number] Age: 55 Admit Type: Outpatient Procedure:                Colonoscopy Indications:              Screening in patient at increased risk: Family                            history of 1st-degree relative with colorectal                            cancer Providers:                Norvel Richards, MD, [place], [place]                            Risa Grill, Technician Referring MD:              Medicines:                Propofol per Anesthesia Complications:            No immediate complications. Estimated Blood Loss:     Estimated blood loss was minimal. Procedure:                Pre-Anesthesia Assessment:                           - Prior to the procedure, a History and Physical                            was performed, and patient medications and                            allergies were reviewed. The patient's tolerance of                            previous anesthesia was also reviewed. The risks                            and benefits of the procedure and the sedation                            options and risks were discussed with the patient.                            All questions were answered, and informed consent                            was obtained. Prior Anticoagulants: The patient has                            taken no previous anticoagulant or antiplatelet                            agents. ASA  Grade Assessment: II - A patient with                            mild systemic disease. After reviewing the risks                            and benefits, the patient was deemed in                            satisfactory condition to undergo the procedure.                           After obtaining informed consent, the colonoscope                            was passed under direct vision.  Throughout the                            procedure, the patient's blood pressure, pulse, and                            oxygen saturations were monitored continuously. The                            CF-[id number] ([id number]) scope was introduced through                            the anus and advanced to the the cecum, identified                            by appendiceal orifice and ileocecal valve. The                            colonoscopy was performed without difficulty. The                            patient tolerated the procedure well. The quality                            of the bowel preparation was adequate. Scope In: 11:01:26 AM Scope Out: 11:17:34 AM Scope Withdrawal Time: 0 hours 12 minutes 39 seconds  Total Procedure Duration: 0 hours 16 minutes 8 seconds  Findings:      The perianal and digital rectal examinations were normal.      Two sessile polyps were found in the sigmoid colon and cecum. The polyps       were 3 to 4 mm in size. These polyps were removed with a cold snare.       Resection and retrieval were complete. Estimated blood loss was minimal.      The exam was otherwise without abnormality on direct and retroflexion       views. Impression:               - Two 3 to 4 mm polyps in the sigmoid colon and in  the cecum, removed with a cold snare. Resected and                            retrieved.                           - The examination was otherwise normal on direct                            and retroflexion views. Moderate Sedation:      Moderate (conscious) sedation was personally administered by an       anesthesia professional. The following parameters were monitored: oxygen       saturation, heart rate, blood pressure, respiratory rate, EKG, adequacy       of pulmonary ventilation, and response to care.      Moderate (conscious) sedation was personally administered by an       anesthesia professional. The following parameters were  monitored: oxygen       saturation, heart rate, blood pressure, respiratory rate, EKG, adequacy       of pulmonary ventilation, and response to care. Recommendation:           - Patient has a contact number available for                            emergencies. The signs and symptoms of potential                            delayed complications were discussed with the                            patient. Return to normal activities tomorrow.                            Written discharge instructions were provided to the                            patient.                           - Resume previous diet.                           - Continue present medications.                           - Repeat colonoscopy date to be determined after                            pending pathology results are reviewed for                            surveillance.                           - Return to GI office (date not yet determined). Procedure Code(s):        --- Professional ---  45385, Colonoscopy, flexible; with removal of                            tumor(s), polyp(s), or other lesion(s) by snare                            technique Diagnosis Code(s):        --- Professional ---                           Z80.0, Family history of malignant neoplasm of                            digestive organs                           K63.5, Polyp of colon CPT copyright 2019 American Medical Association. All rights reserved. The codes documented in this report are preliminary and upon coder review may  be revised to meet current compliance requirements. Cristopher Estimable. Shaft Corigliano, MD Norvel Richards, MD 03/21/2021 11:22:57 AM This report has been signed electronically. Number of Addenda: 0

## 2021-03-21 NOTE — Anesthesia Procedure Notes (Signed)
Date/Time: 03/21/2021 11:05 AM Performed by: Orlie Dakin, CRNA Pre-anesthesia Checklist: Patient identified, Emergency Drugs available, Suction available and Patient being monitored Patient Re-evaluated:Patient Re-evaluated prior to induction Oxygen Delivery Method: Nasal cannula Induction Type: IV induction Placement Confirmation: positive ETCO2

## 2021-03-22 ENCOUNTER — Encounter: Payer: Self-pay | Admitting: Internal Medicine

## 2021-03-22 LAB — SURGICAL PATHOLOGY

## 2021-03-25 ENCOUNTER — Encounter (HOSPITAL_COMMUNITY): Payer: Self-pay | Admitting: Internal Medicine

## 2021-04-21 ENCOUNTER — Encounter: Payer: Self-pay | Admitting: Family Medicine

## 2021-04-23 ENCOUNTER — Ambulatory Visit (INDEPENDENT_AMBULATORY_CARE_PROVIDER_SITE_OTHER): Payer: 59 | Admitting: Family Medicine

## 2021-04-23 ENCOUNTER — Other Ambulatory Visit: Payer: Self-pay

## 2021-04-23 ENCOUNTER — Encounter: Payer: Self-pay | Admitting: Family Medicine

## 2021-04-23 VITALS — HR 102

## 2021-04-23 DIAGNOSIS — U071 COVID-19: Secondary | ICD-10-CM

## 2021-04-23 MED ORDER — PANTOPRAZOLE SODIUM 40 MG PO TBEC: 40.0000 mg | DELAYED_RELEASE_TABLET | Freq: Every day | ORAL | 1 refills | Status: DC

## 2021-04-23 MED ORDER — NIRMATRELVIR/RITONAVIR (PAXLOVID)TABLET: 3.0000 | ORAL_TABLET | Freq: Two times a day (BID) | ORAL | 0 refills | Status: AC

## 2021-04-23 NOTE — Telephone Encounter (Signed)
Nurses-may have 90-day with refill needs follow-up by late fall thanks-Dr. Nicki Reaper

## 2021-04-23 NOTE — Addendum Note (Signed)
Addended by: Vicente Males on: 04/23/2021 04:12 PM   Modules accepted: Orders

## 2021-04-23 NOTE — Progress Notes (Signed)
Patient ID: Darrell Mckinney, male    DOB: 04/24/1966, 55 y.o.   MRN: [id number]   Chief Complaint  Patient presents with   Covid Positive   Subjective:    HPI   Pt having cough, congestion, drainage, runny nose, has felt warm but no temp. Tested negative yesterday but tested positive this morning. Pt would like to be tested again. Pt has been taking Ibuprofen, Tylenol, Mucinex, and hydrocodone cough syrup.   Had negative covid test yesterday.  Then returned home last night from [place]. Slept in and took another test and was positive.  meds- mucinex and tylenol  Hydrocodone cough syrup.   Coughing has been bad when laying down and cough syrup helped him sleep. Several dose of tylenol and ibuprofen. .  Felt more spikes this am of feeling warm.  Mainly clear sputum. Using netti pot.   Oxygen 98% and HR 102.  Medical History Darrell Mckinney has a past medical history of Acid reflux, Back pain, Bulging lumbar disc, Fatigue, GERD (gastroesophageal reflux disease), Hay fever, History of basal cell cancer (12/09/2018), Joint pain, Muscle pain, Sciatic pain, and Seasonal allergies.   Outpatient Encounter Medications as of 04/23/2021  Medication Sig   Ascorbic Acid (VITAMIN C) 1000 MG tablet Take 1,000 mg by mouth daily.   B Complex Vitamins (VITAMIN B COMPLEX PO) Take 1 tablet by mouth daily.   Cholecalciferol (VITAMIN D) 50 MCG (2000 UT) CAPS Take 2,000 Units by mouth daily.   EPINEPHrine 0.3 mg/0.3 mL IJ SOAJ injection Inject 0.3 mg into the skin daily as needed.   ibuprofen (ADVIL,MOTRIN) 800 MG tablet Take 800 mg by mouth as needed.   levocetirizine (XYZAL) 5 MG tablet Take 5 mg by mouth daily.   montelukast (SINGULAIR) 10 MG tablet Take 10 mg by mouth daily. Seasonal usage   [EXPIRED] nirmatrelvir/ritonavir EUA (PAXLOVID) TABS Take 3 tablets by mouth 2 (two) times daily for 5 days. Patient GFR is 78. Take nirmatrelvir (150 mg) two tablets twice daily for 5 days and ritonavir (100 mg) one  tablet twice daily for 5 days.   zolpidem (AMBIEN) 10 MG tablet 1/2 to 1 qhs prn insomnia (Patient taking differently: Take 10 mg by mouth at bedtime as needed for sleep.)   [DISCONTINUED] pantoprazole (PROTONIX) 40 MG tablet Take 40 mg by mouth daily.   No facility-administered encounter medications on file as of 04/23/2021.     Review of Systems  Constitutional:  Positive for chills. Negative for fever.  HENT:  Positive for congestion, rhinorrhea and sore throat.   Respiratory:  Positive for cough. Negative for shortness of breath and wheezing.   Cardiovascular:  Negative for chest pain and leg swelling.  Gastrointestinal:  Negative for abdominal pain, diarrhea, nausea and vomiting.  Genitourinary:  Negative for dysuria and frequency.  Skin:  Negative for rash.  Neurological:  Negative for dizziness, weakness and headaches.    Vitals Pulse (!) 102   SpO2 98%   Objective:   Physical Exam Vitals and nursing note reviewed.  Constitutional:      General: He is not in acute distress.    Appearance: Normal appearance. He is not ill-appearing.  Pulmonary:     Effort: Pulmonary effort is normal. No respiratory distress.  Neurological:     Mental Status: He is alert and oriented to person, place, and time.     Assessment and Plan   1. COVID - Novel Coronavirus, NAA ([facility])   Pt wanting to recheck covid with  pcr.  Ordered this. Pt had positive home covid test today.  NAD, non labored breathing.   -ordered paxlovid reviewed labs for liver or renal disease.   GFR 78.  Reviewed risk vs. Benefit and common side effects.  Pt to call or rto if worsening symptoms.   Pt voiced understanding.   Covid 19 recommendations- It's symptomatic tx for viral illness. Take mucinex as directed, could change to delsym if needed.  Increase fluid intake. Vit C, vit D and zinc are recommended vitamins to take.  zinc dose of 75 mg-100mg . daily doses of vitamin D3 (1,000-4,000 IU), vitamin C (500  mg), and melatonin (0.3mg -2 mg each night).  Flonase for nasal congestion.  If not getting better with all this, then need to go to ER/urgent care for further evaluation and check of vitals.   Return if symptoms worsen or fail to improve.

## 2021-04-24 ENCOUNTER — Telehealth: Payer: Self-pay | Admitting: Family Medicine

## 2021-04-24 LAB — SARS-COV-2, NAA 2 DAY TAT

## 2021-04-24 LAB — NOVEL CORONAVIRUS, NAA: SARS-CoV-2, NAA: DETECTED — AB

## 2021-04-24 NOTE — Telephone Encounter (Signed)
Patient would like a prescription for Covid test from pharmacy insurance will pay for it..Also needing work note for testing positive at home 5/31 but was tested at office visit yesterday also. He needs note for his work of how long he will be out. Please advise

## 2021-04-24 NOTE — Telephone Encounter (Signed)
Patient stated he has a copy of the positive test for work and will be following his work protocols for return.

## 2021-04-24 NOTE — Telephone Encounter (Signed)
Pls give him script for covid test if he wants it.  He is positive on his covid pcr test.   Also give him work note he needs.  Needs to be quarantining for 5 days from start of symptoms. If day 6 free of symptoms and no fever, then can return to work with masking for 5 additional days.  Thx.   Dr. Lovena Le

## 2021-09-13 ENCOUNTER — Telehealth: Payer: Self-pay | Admitting: Family Medicine

## 2021-09-13 DIAGNOSIS — R7301 Impaired fasting glucose: Secondary | ICD-10-CM

## 2021-09-13 DIAGNOSIS — Z79899 Other long term (current) drug therapy: Secondary | ICD-10-CM

## 2021-09-13 DIAGNOSIS — E782 Mixed hyperlipidemia: Secondary | ICD-10-CM

## 2021-09-13 NOTE — Telephone Encounter (Signed)
Blood work ordered in Epic. Patient notified. 

## 2021-09-13 NOTE — Telephone Encounter (Signed)
Last labs 09/17/2020: Lipid, Liver, Met 7, PSA, Vit D

## 2021-09-13 NOTE — Telephone Encounter (Signed)
Patient has [place] 11/2 and inquired about his labs. He will be travelling and will have difficulty making an appointment for labs. He is requesting that if orders could be sent today to [place], he is able to gt them done Monday 10/24. Please advise.  [phone]

## 2021-09-17 LAB — CBC WITH DIFFERENTIAL/PLATELET
Basophils Absolute: 0 10*3/uL (ref 0.0–0.2)
Basos: 0 %
EOS (ABSOLUTE): 0.1 10*3/uL (ref 0.0–0.4)
Eos: 1 %
Hematocrit: 44.5 % (ref 37.5–51.0)
Hemoglobin: 15.4 g/dL (ref 13.0–17.7)
Immature Grans (Abs): 0 10*3/uL (ref 0.0–0.1)
Immature Granulocytes: 0 %
Lymphocytes Absolute: 1.6 10*3/uL (ref 0.7–3.1)
Lymphs: 28 %
MCH: 33.4 pg — ABNORMAL HIGH (ref 26.6–33.0)
MCHC: 34.6 g/dL (ref 31.5–35.7)
MCV: 97 fL (ref 79–97)
Monocytes Absolute: 0.6 10*3/uL (ref 0.1–0.9)
Monocytes: 11 %
Neutrophils Absolute: 3.4 10*3/uL (ref 1.4–7.0)
Neutrophils: 60 %
Platelets: 147 10*3/uL — ABNORMAL LOW (ref 150–450)
RBC: 4.61 x10E6/uL (ref 4.14–5.80)
RDW: 11.9 % (ref 11.6–15.4)
WBC: 5.7 10*3/uL (ref 3.4–10.8)

## 2021-09-17 LAB — COMPREHENSIVE METABOLIC PANEL
ALT: 26 IU/L (ref 0–44)
AST: 24 IU/L (ref 0–40)
Albumin/Globulin Ratio: 2.7 — ABNORMAL HIGH (ref 1.2–2.2)
Albumin: 4.8 g/dL (ref 3.8–4.9)
Alkaline Phosphatase: 62 IU/L (ref 44–121)
BUN/Creatinine Ratio: 19 (ref 9–20)
BUN: 20 mg/dL (ref 6–24)
Bilirubin Total: 0.3 mg/dL (ref 0.0–1.2)
CO2: 25 mmol/L (ref 20–29)
Calcium: 9.4 mg/dL (ref 8.7–10.2)
Chloride: 104 mmol/L (ref 96–106)
Creatinine, Ser: 1.05 mg/dL (ref 0.76–1.27)
Globulin, Total: 1.8 g/dL (ref 1.5–4.5)
Glucose: 108 mg/dL — ABNORMAL HIGH (ref 70–99)
Potassium: 4.5 mmol/L (ref 3.5–5.2)
Sodium: 141 mmol/L (ref 134–144)
Total Protein: 6.6 g/dL (ref 6.0–8.5)
eGFR: 84 mL/min/{1.73_m2} (ref >59)

## 2021-09-17 LAB — LIPID PANEL
Chol/HDL Ratio: 4.3 ratio (ref 0.0–5.0)
Cholesterol, Total: 128 mg/dL (ref 100–199)
HDL: 30 mg/dL — ABNORMAL LOW (ref >39)
LDL Chol Calc (NIH): 49 mg/dL (ref 0–99)
Triglycerides: 323 mg/dL — ABNORMAL HIGH (ref 0–149)
VLDL Cholesterol Cal: 49 mg/dL — ABNORMAL HIGH (ref 5–40)

## 2021-09-25 ENCOUNTER — Other Ambulatory Visit: Payer: Self-pay

## 2021-09-25 ENCOUNTER — Encounter: Payer: Self-pay | Admitting: Family Medicine

## 2021-09-25 ENCOUNTER — Ambulatory Visit (INDEPENDENT_AMBULATORY_CARE_PROVIDER_SITE_OTHER): Payer: 59 | Admitting: Family Medicine

## 2021-09-25 VITALS — BP 126/78 | HR 71 | Temp 98.1°F | Ht 73.0 in | Wt 221.0 lb

## 2021-09-25 DIAGNOSIS — R739 Hyperglycemia, unspecified: Secondary | ICD-10-CM | POA: Diagnosis not present

## 2021-09-25 DIAGNOSIS — Z23 Encounter for immunization: Secondary | ICD-10-CM | POA: Diagnosis not present

## 2021-09-25 DIAGNOSIS — R7301 Impaired fasting glucose: Secondary | ICD-10-CM

## 2021-09-25 DIAGNOSIS — Z0001 Encounter for general adult medical examination with abnormal findings: Secondary | ICD-10-CM | POA: Diagnosis not present

## 2021-09-25 DIAGNOSIS — Z0000 Encounter for general adult medical examination without abnormal findings: Secondary | ICD-10-CM

## 2021-09-25 DIAGNOSIS — M21371 Foot drop, right foot: Secondary | ICD-10-CM | POA: Diagnosis not present

## 2021-09-25 DIAGNOSIS — M25552 Pain in left hip: Secondary | ICD-10-CM

## 2021-09-25 DIAGNOSIS — E7849 Other hyperlipidemia: Secondary | ICD-10-CM

## 2021-09-25 NOTE — Progress Notes (Signed)
   Subjective:    Patient ID: Darrell Mckinney, male    DOB: 11-29-1965, 55 y.o.   MRN: [id number]  HPI The patient comes in today for a wellness visit.    A review of their health history was completed.  A review of medications was also completed.  Any needed refills; none  Eating habits: good  Falls/  MVA accidents in past few months: no  Regular exercise: yes gym workout  Specialist pt sees on regular basis: allergist/ chiropractor  Preventative health issues were discussed.   Additional concerns: foot drop R, L hip pain  Going through some stress working through a divorce Patient denies being depressed Intermittent right foot drop that occurs sporadically.  Had previous back surgery. Also has left hip pain discomfort anteriorly with squatting and standing Review of Systems     Objective:   Physical Exam  General-in no acute distress Eyes-no discharge Lungs-respiratory rate normal, CTA CV-no murmurs,RRR Extremities skin warm dry no edema Neuro grossly normal Behavior normal, alert       Assessment & Plan:  Adult wellness-complete.wellness physical was conducted today. Importance of diet and exercise were discussed in detail.  In addition to this a discussion regarding safety was also covered. We also reviewed over immunizations and gave recommendations regarding current immunization needed for age.  In addition to this additional areas were also touched on including: Preventative health exams needed:  Colonoscopy 2027  Patient was advised yearly wellness exam  1. Wellness examination Please see above, healthy diet discussed, regular physical activity, patient not depressed - Flu Vaccine QUAD 4mo+IM (Fluarix, Fluzone & Alfiuria Quad PF) - VITAMIN D 25 Hydroxy (Vit-D Deficiency, Fractures) - Lipid panel - Glucose - Hemoglobin A1c  2. Other hyperlipidemia Elevated triglycerides discussed the importance of minimizing carbohydrates regular physical  activity repeat is again in 3 months - VITAMIN D 25 Hydroxy (Vit-D Deficiency, Fractures) - Lipid panel - Glucose - Hemoglobin A1c  3. Hyperglycemia Hyperglycemia Will check A1c may have prediabetes I doubt diabetes.  Minimize starches regular activity patient has brought his weight down - VITAMIN D 25 Hydroxy (Vit-D Deficiency, Fractures) - Lipid panel - Glucose - Hemoglobin A1c  4. Right foot drop There is no foot drop noted currently.  But he had previous back surgery in [place] we will set him up to follow-up there.  He states he has intermittent foot drop.  If neurosurgery does not find anything consideration for neurology referral or EMG nerve conduction study or physical therapy - Ambulatory referral to Neurology  5. Left hip pain Intermittent left hip pain with internal and external rotation consistent with possibility of arthritic changes recommend x-ray - DG HIP UNILAT WITH PELVIS 2-3 VIEWS LEFT  6. Fasting hyperglycemia A1c glucose as discussed above healthy diet regular activity - Glucose - Hemoglobin A1c  Follow-up within 1 year

## 2021-09-25 NOTE — Patient Instructions (Addendum)

## 2021-09-26 ENCOUNTER — Other Ambulatory Visit: Payer: Self-pay | Admitting: *Deleted

## 2021-09-26 DIAGNOSIS — M21371 Foot drop, right foot: Secondary | ICD-10-CM

## 2021-09-26 NOTE — Progress Notes (Signed)
Referral ordered in EPIC. 

## 2021-10-03 ENCOUNTER — Ambulatory Visit (HOSPITAL_COMMUNITY)
Admission: RE | Admit: 2021-10-03 | Discharge: 2021-10-03 | Disposition: A | Discharge status: Home / Self Care | Payer: 59 | Source: Ambulatory Visit | Attending: Family Medicine | Admitting: Family Medicine

## 2021-10-03 ENCOUNTER — Other Ambulatory Visit: Payer: Self-pay

## 2021-10-03 DIAGNOSIS — M25552 Pain in left hip: Secondary | ICD-10-CM | POA: Diagnosis present

## 2021-10-24 ENCOUNTER — Other Ambulatory Visit: Payer: Self-pay | Admitting: Family Medicine

## 2022-03-23 ENCOUNTER — Encounter: Payer: Self-pay | Admitting: Family Medicine

## 2022-03-24 MED ORDER — PANTOPRAZOLE SODIUM 40 MG PO TBEC: 40.0000 mg | DELAYED_RELEASE_TABLET | Freq: Every day | ORAL | 1 refills | Status: DC

## 2022-03-24 NOTE — Telephone Encounter (Signed)
Nurses-please send in 90-day supply with 1 refill and notify [place] thank you ?

## 2022-04-28 ENCOUNTER — Other Ambulatory Visit: Payer: Self-pay | Admitting: Family Medicine

## 2022-04-28 ENCOUNTER — Encounter: Payer: Self-pay | Admitting: Family Medicine

## 2022-04-28 MED ORDER — ZOLPIDEM TARTRATE 10 MG PO TABS: ORAL_TABLET | ORAL | 0 refills | Status: AC

## 2022-04-28 NOTE — Telephone Encounter (Signed)
Patient last seen on 09/25/21 for wellness exam. Please advise. Thank you

## 2022-06-24 ENCOUNTER — Ambulatory Visit (INDEPENDENT_AMBULATORY_CARE_PROVIDER_SITE_OTHER): Payer: Commercial Managed Care - PPO | Admitting: Family Medicine

## 2022-06-24 ENCOUNTER — Encounter: Payer: Self-pay | Admitting: Family Medicine

## 2022-06-24 VITALS — HR 92 | Temp 98.8°F | Wt 236.2 lb

## 2022-06-24 DIAGNOSIS — R6889 Other general symptoms and signs: Secondary | ICD-10-CM

## 2022-06-24 DIAGNOSIS — J019 Acute sinusitis, unspecified: Secondary | ICD-10-CM

## 2022-06-24 MED ORDER — HYDROCODONE BIT-HOMATROP MBR 5-1.5 MG/5ML PO SOLN: 5.0000 mL | Freq: Four times a day (QID) | ORAL | 0 refills | Status: AC | PRN

## 2022-06-24 MED ORDER — AMOXICILLIN 500 MG PO CAPS: 500.0000 mg | ORAL_CAPSULE | Freq: Three times a day (TID) | ORAL | 0 refills | Status: AC

## 2022-06-24 MED ORDER — BENZONATATE 100 MG PO CAPS: 100.0000 mg | ORAL_CAPSULE | Freq: Three times a day (TID) | ORAL | 0 refills | Status: DC | PRN

## 2022-06-24 NOTE — Progress Notes (Signed)
   Subjective:    Patient ID: Darrell Mckinney, male    DOB: [birth year], 56 y.o.   MRN: [id number]  HPI Pt arrives with congestion, cough, drainage, sinus issues, head pounding, feels hot (unable to check temp at home), sore/achy (could be from lifting weights). Mild symptoms began on Saturday night; have worsened since.  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

## 2022-06-25 LAB — COVID-19, FLU A+B AND RSV
Influenza A, NAA: DETECTED — AB
Influenza B, NAA: NOT DETECTED
RSV, NAA: NOT DETECTED
SARS-CoV-2, NAA: NOT DETECTED

## 2022-07-02 ENCOUNTER — Encounter (INDEPENDENT_AMBULATORY_CARE_PROVIDER_SITE_OTHER): Payer: Self-pay

## 2022-08-23 ENCOUNTER — Other Ambulatory Visit: Payer: Self-pay | Admitting: Family Medicine

## 2022-10-30 ENCOUNTER — Encounter: Payer: Self-pay | Admitting: Family Medicine

## 2022-10-30 ENCOUNTER — Ambulatory Visit (INDEPENDENT_AMBULATORY_CARE_PROVIDER_SITE_OTHER): Payer: Commercial Managed Care - PPO | Admitting: Family Medicine

## 2022-10-30 VITALS — BP 131/82 | HR 74 | Wt 239.6 lb

## 2022-10-30 DIAGNOSIS — L03115 Cellulitis of right lower limb: Secondary | ICD-10-CM | POA: Insufficient documentation

## 2022-10-30 DIAGNOSIS — Z23 Encounter for immunization: Secondary | ICD-10-CM | POA: Diagnosis not present

## 2022-10-30 MED ORDER — CEPHALEXIN 500 MG PO CAPS: 500.0000 mg | ORAL_CAPSULE | Freq: Four times a day (QID) | ORAL | 0 refills | Status: AC

## 2022-10-30 NOTE — Progress Notes (Signed)
Subjective:  Patient ID: Darrell Mckinney, male    DOB: Dec 25, 1965  Age: 56 y.o. MRN: [id number]  CC: Chief Complaint  Patient presents with   Ankle Pain    Pt having right ankle pain since Tuesday. Red area on ankle. Pt states he thinks he may have sprained something getting out of car.     HPI:  56 year old male presents for evaluation of the above.  Patient reports that he developed right ankle pain on Tuesday.  It is located on the medial aspect of his ankle and proximal foot.  He is concerned that he injured the area but does not recall doing so.  He has a discrete area of redness and warmth.  It is quite tender.  He has been using ice and ibuprofen without relief.  No fever.  He otherwise feels well.  No other complaints or concerns at this time.  Patient Active Problem List   Diagnosis Date Noted   Cellulitis of right lower extremity 10/30/2022   History of basal cell cancer 12/09/2018   Gastroesophageal reflux disease without esophagitis 02/08/2018   FH: colon cancer 10/01/2015    Social Hx   Social History   Socioeconomic History   Marital status: Married    Spouse name: Lattie Haw   Number of children: 2   Years of education: Not on file   Highest education level: Not on file  Occupational History   Occupation: Arts development officer  Tobacco Use   Smoking status: Never   Smokeless tobacco: Never  Substance and Sexual Activity   Alcohol use: Yes    Alcohol/week: 0.0 standard drinks of alcohol    Comment: six pack per week   Drug use: No   Sexual activity: Not on file  Other Topics Concern   Not on file  Social History Narrative   Not on file   Social Determinants of Health   Financial Resource Strain: Not on file  Food Insecurity: Not on file  Transportation Needs: Not on file  Physical Activity: Not on file  Stress: Not on file  Social Connections: Not on file    Review of Systems Per HPI  Objective:  BP 131/82   Pulse 74   Wt 239 lb 9.6 oz  (108.7 kg)   SpO2 98%   BMI 31.61 kg/m      10/30/2022    3:53 PM 06/24/2022    3:19 PM 09/25/2021    1:40 PM  BP/Weight  Systolic BP 409  811  Diastolic BP 82  78  Wt. (Lbs) 239.6 236.2 221  BMI 31.61 kg/m2 31.16 kg/m2 29.16 kg/m2    Physical Exam Constitutional:      General: He is not in acute distress.    Appearance: Normal appearance.  HENT:     Head: Normocephalic and atraumatic.  Pulmonary:     Effort: Pulmonary effort is normal. No respiratory distress.  Musculoskeletal:     Comments: Medial right ankle with discrete area of erythema and warmth.  Area blanches.  Tender to palpation.  No fluctuance.  Neurological:     Mental Status: He is alert.  Psychiatric:        Mood and Affect: Mood normal.        Behavior: Behavior normal.     Lab Results  Component Value Date   WBC 5.7 09/16/2021   HGB 15.4 09/16/2021   HCT 44.5 09/16/2021   PLT 147 (L) 09/16/2021   GLUCOSE 108 (H) 09/16/2021   CHOL  128 09/16/2021   TRIG 323 (H) 09/16/2021   HDL 30 (L) 09/16/2021   LDLCALC 49 09/16/2021   ALT 26 09/16/2021   AST 24 09/16/2021   NA 141 09/16/2021   K 4.5 09/16/2021   CL 104 09/16/2021   CREATININE 1.05 09/16/2021   BUN 20 09/16/2021   CO2 25 09/16/2021   TSH 1.740 02/08/2018   HGBA1C 5.0 01/04/2020     Assessment & Plan:   Problem List Items Addressed This Visit       Other   Cellulitis of right lower extremity - Primary    Patient's clinical exam is consistent with cellulitis.  Treating with Keflex. Discussed that if this worsens (worsening erythema, fever) he should seek medical attention.      Other Visit Diagnoses     Need for vaccination       Relevant Orders   Flu Vaccine QUAD 6+ mos PF IM (Fluarix Quad PF) (Completed)       Meds ordered this encounter  Medications   cephALEXin (KEFLEX) 500 MG capsule    Sig: Take 1 capsule (500 mg total) by mouth 4 (four) times daily for 7 days.    Dispense:  28 capsule    Refill:  0    Follow-up:   No follow-ups on file.  [place]

## 2022-10-30 NOTE — Assessment & Plan Note (Addendum)
Patient's clinical exam is consistent with cellulitis.  Treating with Keflex. Discussed that if this worsens (worsening erythema, fever) he should seek medical attention.

## 2022-11-10 ENCOUNTER — Telehealth: Payer: Commercial Managed Care - PPO | Admitting: Nurse Practitioner

## 2022-11-10 DIAGNOSIS — R051 Acute cough: Secondary | ICD-10-CM

## 2022-11-10 MED ORDER — PREDNISONE 10 MG (21) PO TBPK: ORAL_TABLET | ORAL | 0 refills | Status: DC

## 2022-11-10 NOTE — Progress Notes (Signed)
We are sorry that you are not feeling well.  Here is how we plan to help!  Based on your presentation I believe you most likely have A cough due to a virus.  This is called viral bronchitis and is best treated by rest, plenty of fluids and control of the cough.  You may use Ibuprofen or Tylenol as directed to help your symptoms.     You should continue to use: A non-prescription cough medication called Mucinex DM: take 2 tablets every 12 hours. This is the best cough medication for a productive/mucous involved cough. It will help clear the congestion from your chest  Prednisone 10 mg daily for 6 days (see taper instructions below)  Meds ordered this encounter  Medications   predniSONE (STERAPRED UNI-PAK 21 TAB) 10 MG (21) TBPK tablet    Sig: Take 6 tablets on day one, 5 on day two, 4 on day three, 3 on day four, 2 on day five, and 1 on day six. Take with food.    Dispense:  21 tablet    Refill:  0     From your responses in the eVisit questionnaire you describe inflammation in the upper respiratory tract which is causing a significant cough.  This is commonly called Bronchitis and has four common causes:   Allergies Viral Infections Acid Reflux Bacterial Infection Allergies, viruses and acid reflux are treated by controlling symptoms or eliminating the cause.       HOME CARE Only take medications as instructed by your medical team. Complete the entire course of an antibiotic. Drink plenty of fluids and get plenty of rest. Avoid close contacts especially the very young and the elderly Cover your mouth if you cough or cough into your sleeve. Always remember to wash your hands A steam or ultrasonic humidifier can help congestion.   GET HELP RIGHT AWAY IF: You develop worsening fever. You become short of breath You cough up blood. Your symptoms persist after you have completed your treatment plan MAKE SURE YOU  Understand these instructions. Will watch your condition. Will get  help right away if you are not doing well or get worse.    Thank you for choosing an e-visit.  Your e-visit answers were reviewed by a board certified advanced clinical practitioner to complete your personal care plan. Depending upon the condition, your plan could have included both over the counter or prescription medications.  Please review your pharmacy choice. Make sure the pharmacy is open so you can pick up prescription now. If there is a problem, you may contact your provider through [employer] and have the prescription routed to another pharmacy.  Your safety is important to [name]. If you have drug allergies check your prescription carefully.   For the next 24 hours you can use MyChart to ask questions about today's visit, request a non-urgent call back, or ask for a work or school excuse. You will get an email in the next two days asking about your experience. I hope that your e-visit has been valuable and will speed your recovery.   I spent approximately 5 minutes reviewing the patient's history, current symptoms and coordinating their care today.

## 2022-11-14 IMAGING — DX DG HIP (WITH OR WITHOUT PELVIS) 2-3V*L*
3 series · 3 of 3 positions shown · non-contrast
Comparison: None.

CLINICAL DATA: Left hip pain for several years

EXAM:
DG HIP (WITH OR WITHOUT PELVIS) 2-3V LEFT

[pelvis ap]
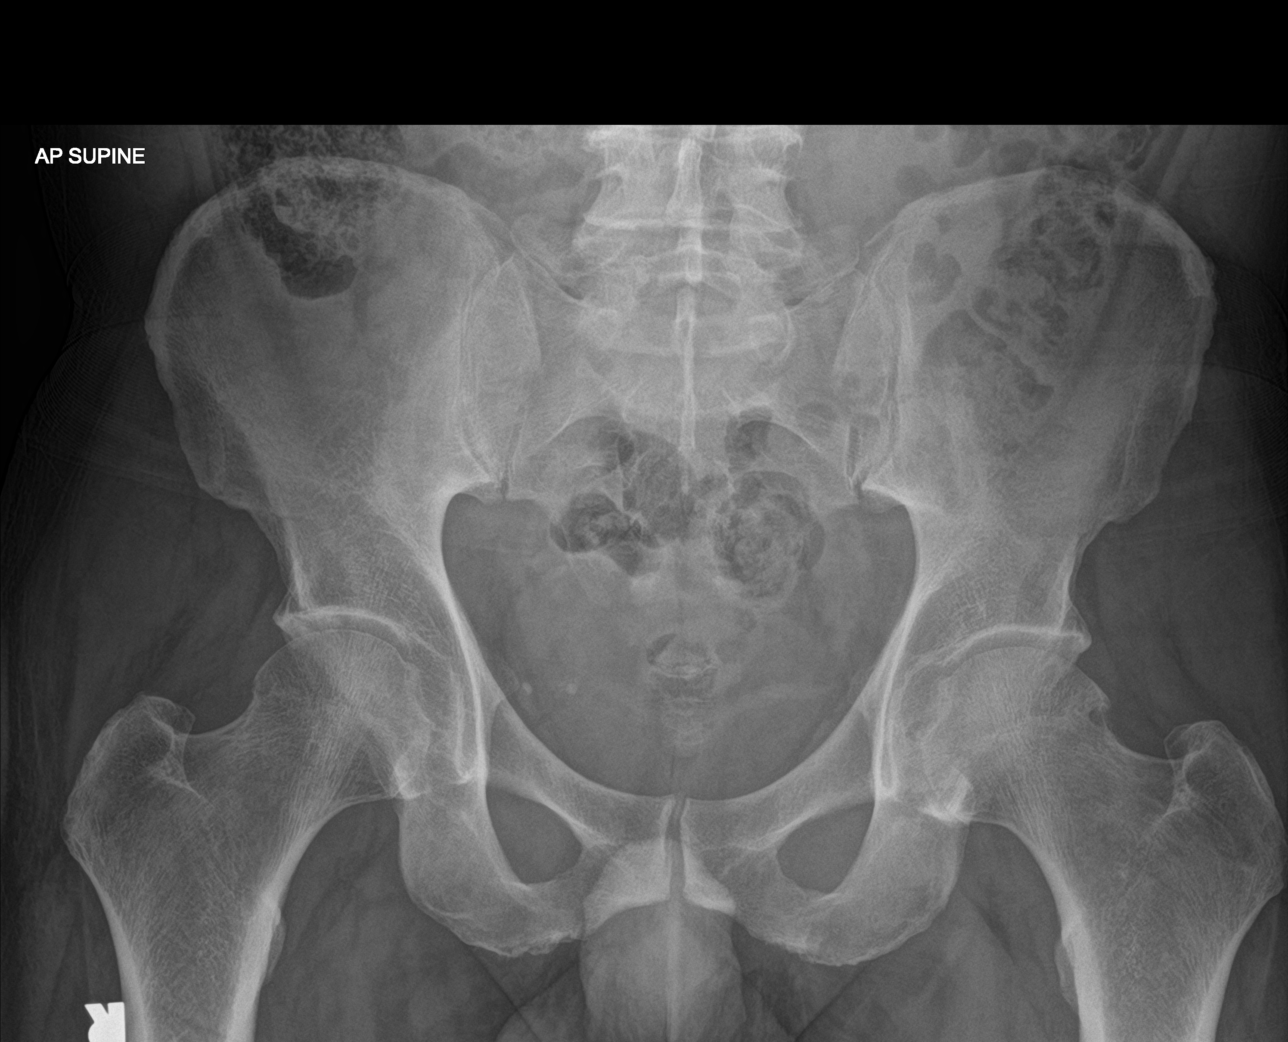

[hip ap]
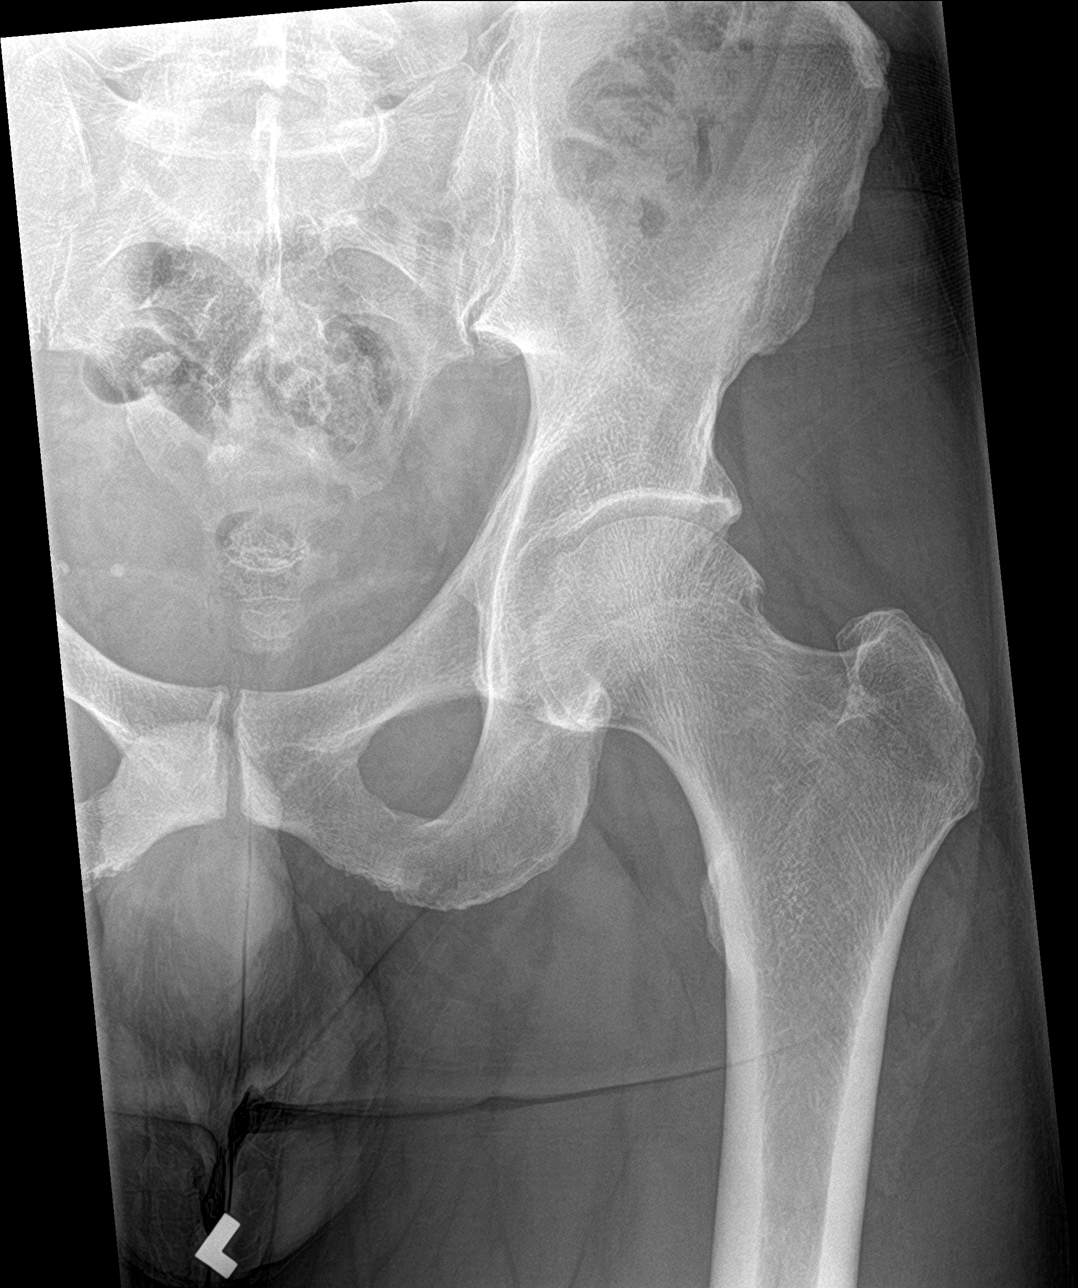

[hip frog leg]
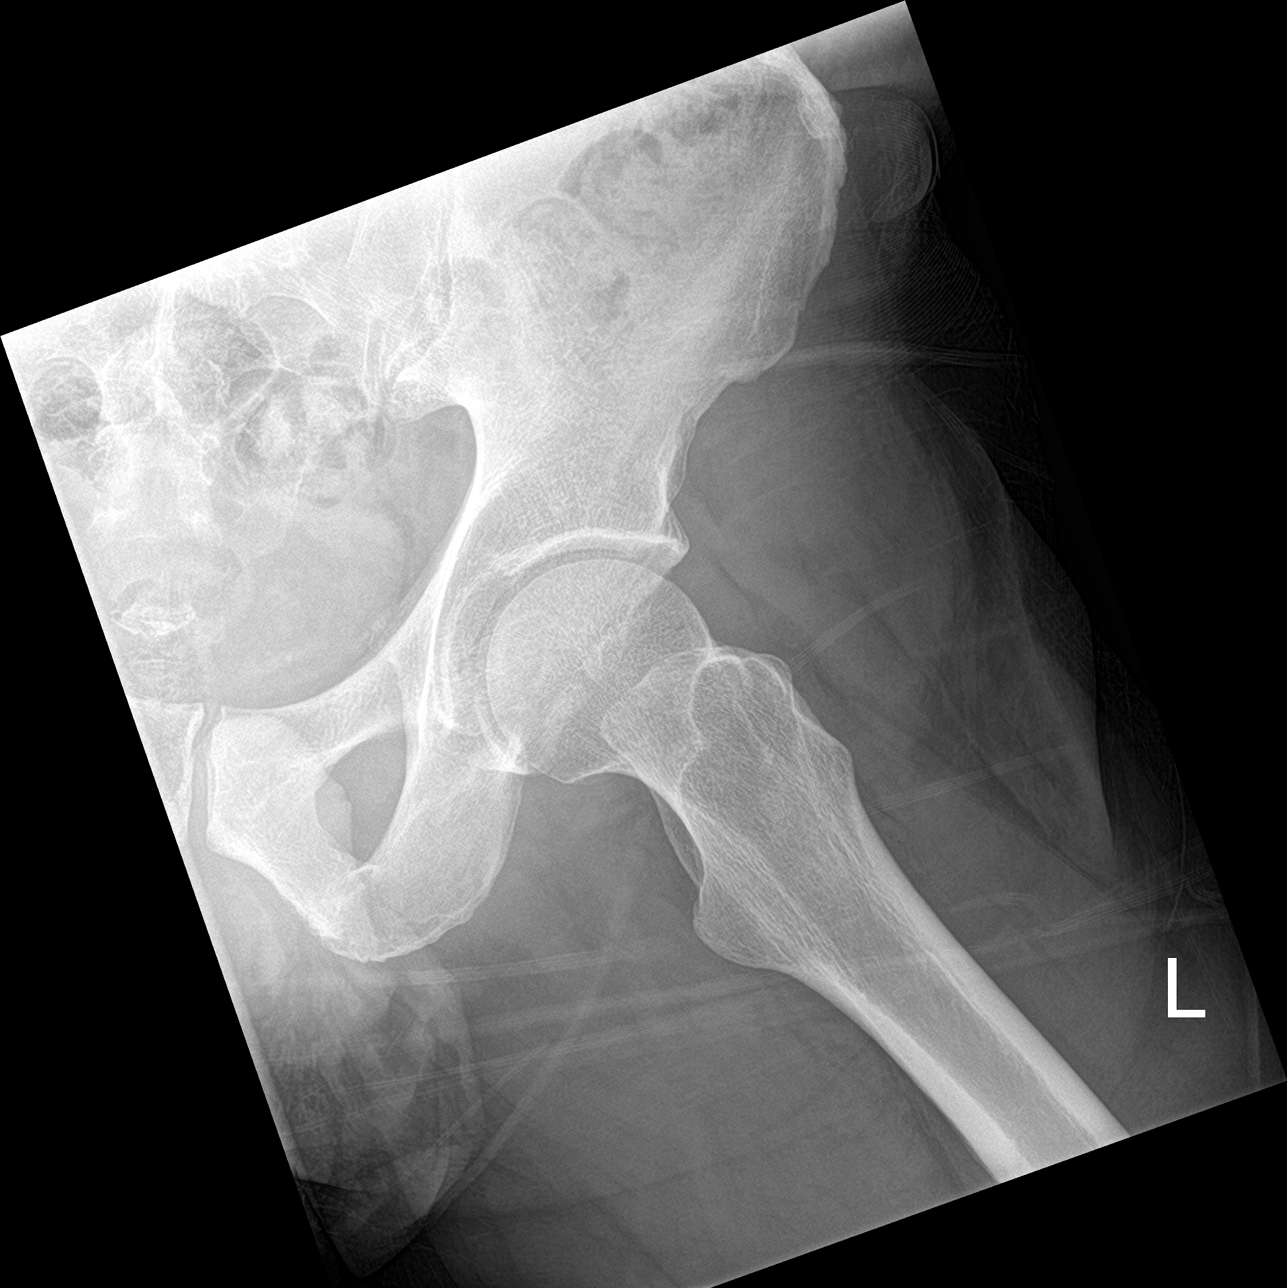

[3 of 3 positions shown; findings below may reference images not displayed]

FINDINGS: Frontal view of the pelvis as well as frontal and frogleg lateral
views of the left hip are obtained. No fracture, subluxation, or
dislocation. Mild symmetrical bilateral hip osteoarthritis.
Sacroiliac joints are normal.
IMPRESSION: 1. Mild symmetrical bilateral hip osteoarthritis. No acute bony
abnormality.

## 2022-11-18 ENCOUNTER — Ambulatory Visit
Admission: EM | Admit: 2022-11-18 | Discharge: 2022-11-18 | Disposition: A | Discharge status: Home / Self Care | Payer: Commercial Managed Care - PPO | Attending: Family Medicine | Admitting: Family Medicine

## 2022-11-18 DIAGNOSIS — J0100 Acute maxillary sinusitis, unspecified: Secondary | ICD-10-CM | POA: Diagnosis not present

## 2022-11-18 DIAGNOSIS — R051 Acute cough: Secondary | ICD-10-CM | POA: Diagnosis not present

## 2022-11-18 MED ORDER — AMOXICILLIN-POT CLAVULANATE 875-125 MG PO TABS: 1.0000 | ORAL_TABLET | Freq: Two times a day (BID) | ORAL | 0 refills | Status: DC

## 2022-11-18 NOTE — ED Triage Notes (Signed)
Pt presents with ongoing post-nasal drip and chest congestion x 2 weeks. Mult neg COVID tests. Had Prednisone from [facility] and taking OTC Mucinex but no improvement. Reports maxillary pressure and nasal congestion sometimes yellow.  At two weeks for s/s.

## 2022-11-18 NOTE — ED Provider Notes (Signed)
[facility]    CSN: [id number] Arrival date & time: 11/18/22  1037      History   Chief Complaint Chief Complaint  Patient presents with   Nasal Congestion   Facial Pain    HPI Darrell Mckinney is a 56 y.o. male.   Patient presenting today with 2-week history of nasal congestion, sinus pain and pressure, productive cough, chest congestion.  Denies chest pain, shortness of breath, fever, chills, body aches, abdominal pain, nausea vomiting or diarrhea.  Did an e-visit and took a course of prednisone and Mucinex with no relief.  Also doing sinus rinses over-the-counter with minimal relief.  Denies any chronic pulmonary disease or sick contacts recently.    Past Medical History:  Diagnosis Date   Acid reflux    Back pain    Bulging lumbar disc    Fatigue    GERD (gastroesophageal reflux disease)    Hay fever    History of basal cell cancer 12/09/2018   Removed December 2020 left inner eye skin fold   Joint pain    Muscle pain    Sciatic pain    Seasonal allergies     Patient Active Problem List   Diagnosis Date Noted   Cellulitis of right lower extremity 10/30/2022   History of basal cell cancer 12/09/2018   Gastroesophageal reflux disease without esophagitis 02/08/2018   FH: colon cancer 10/01/2015    Past Surgical History:  Procedure Laterality Date   BACK SURGERY  ~2006   COLONOSCOPY  12/2008   RMR: normal   COLONOSCOPY N/A 10/26/2015   Procedure: COLONOSCOPY;  Surgeon: Daneil Dolin, MD;  [facility];  Service: Endoscopy;  Laterality: N/A;  11:15 - moved to 12:00 - pt knows to arrive at 11:00   COLONOSCOPY WITH PROPOFOL N/A 03/21/2021   Procedure: COLONOSCOPY WITH PROPOFOL;  Surgeon: Daneil Dolin, MD;  [facility]: [facility];  Service: Endoscopy;  Laterality: N/A;  ASA II / 10:45   POLYPECTOMY  03/21/2021   Procedure: POLYPECTOMY INTESTINAL;  Surgeon: Daneil Dolin, MD;  Location: [facility];  Service: Endoscopy;;   TURBINATE  RESECTION Bilateral 01/25/2013   Procedure: BILATERAL TURBINATE RESECTION;  Surgeon: Ascencion Dike, MD;  Location: [facility];  Service: ENT;  Laterality: Bilateral;       Home Medications    Prior to Admission medications   Medication Sig Start Date End Date Taking? Authorizing Provider  amoxicillin-clavulanate (AUGMENTIN) 875-125 MG tablet Take 1 tablet by mouth every 12 (twelve) hours. 11/18/22  Yes Volney American, PA-C  Ascorbic Acid (VITAMIN C) 1000 MG tablet Take 1,000 mg by mouth daily.    [provider]  B Complex Vitamins (VITAMIN B COMPLEX PO) Take 1 tablet by mouth daily.    [provider]  benzonatate (TESSALON PERLES) 100 MG capsule Take 1 capsule (100 mg total) by mouth 3 (three) times daily as needed for cough. 06/24/22   Kathyrn Drown, MD  Cholecalciferol (VITAMIN D) 50 MCG (2000 UT) CAPS Take 2,000 Units by mouth daily.    [provider]  EPINEPHrine 0.3 mg/0.3 mL IJ SOAJ injection Inject 0.3 mg into the skin daily as needed.    [provider]  ibuprofen (ADVIL,MOTRIN) 800 MG tablet Take 800 mg by mouth as needed.    [provider]  levocetirizine (XYZAL) 5 MG tablet Take 5 mg by mouth daily. 10/25/20   [provider]  montelukast (SINGULAIR) 10 MG  tablet Take 10 mg by mouth daily. Seasonal usage    [provider]  pantoprazole (PROTONIX) 40 MG tablet TAKE 1 TABLET BY MOUTH DAILY 08/25/22   Kathyrn Drown, MD  predniSONE (STERAPRED UNI-PAK 21 TAB) 10 MG (21) TBPK tablet Take 6 tablets on day one, 5 on day two, 4 on day three, 3 on day four, 2 on day five, and 1 on day six. Take with food. 11/10/22   Apolonio Schneiders, FNP  zolpidem Lorrin Mais) 10 MG tablet 1/2 to 1 qhs prn insomnia 04/28/22   Kathyrn Drown, MD    Family History Family History  Problem Relation Age of Onset   Colon cancer Father        dx age 74, deceased age 72   Colon cancer Paternal Uncle        age 58s   Stroke Mother      Social History Social History   Tobacco Use   Smoking status: Never   Smokeless tobacco: Never  Substance Use Topics   Alcohol use: Yes    Alcohol/week: 0.0 standard drinks of alcohol    Comment: six pack per week   Drug use: No     Allergies   Patient has no known allergies.   Review of Systems Review of Systems HPI  Physical Exam Triage Vital Signs ED Triage Vitals  Enc Vitals Group     BP 11/18/22 1344 123/85     Pulse Rate 11/18/22 1344 (!) 57     Resp 11/18/22 1344 16     Temp 11/18/22 1344 97.8 F (36.6 C)     Temp Source 11/18/22 1344 Oral     SpO2 11/18/22 1344 96 %     Weight --      Height --      Head Circumference --      Peak Flow --      Pain Score 11/18/22 1341 0     Pain Loc --      Pain Edu? --      Excl. in [place]? --    No data found.  Updated Vital Signs BP 123/85 (BP Location: Right Arm)   Pulse (!) 57   Temp 97.8 F (36.6 C) (Oral)   Resp 16   SpO2 96%   Visual Acuity Right Eye Distance:   Left Eye Distance:   Bilateral Distance:    Right Eye Near:   Left Eye Near:    Bilateral Near:     Physical Exam Vitals and nursing note reviewed.  Constitutional:      Appearance: He is well-developed.  HENT:     Head: Atraumatic.     Right Ear: External ear normal.     Left Ear: External ear normal.     Nose: Congestion present.     Mouth/Throat:     Pharynx: Posterior oropharyngeal erythema present. No oropharyngeal exudate.  Eyes:     Conjunctiva/sclera: Conjunctivae normal.     Pupils: Pupils are equal, round, and reactive to light.  Cardiovascular:     Rate and Rhythm: Normal rate and regular rhythm.  Pulmonary:     Effort: Pulmonary effort is normal. No respiratory distress.     Breath sounds: No wheezing or rales.  Musculoskeletal:        General: Normal range of motion.     Cervical back: Normal range of motion and neck supple.  Lymphadenopathy:     Cervical: No cervical adenopathy.  Skin:    General:  Skin is  warm and dry.  Neurological:     Mental Status: He is alert and oriented to person, place, and time.  Psychiatric:        Behavior: Behavior normal.      UC Treatments / Results  Labs (all labs ordered are listed, but only abnormal results are displayed) Labs Reviewed - No data to display  EKG   Radiology No results found.  Procedures Procedures (including critical care time)  Medications Ordered in UC Medications - No data to display  Initial Impression / Assessment and Plan / UC Course  I have reviewed the triage vital signs and the nursing notes.  Pertinent labs & imaging results that were available during my care of the patient were reviewed by me and considered in my medical decision making (see chart for details).     Given duration and worsening course, treat with Augmentin, continued sinus rinses, nasal sprays, over-the-counter cold and congestion medications.  Discussed if history of seasonal allergies to be consistent with Zyrtec in addition to the nasal sprays.  Return for worsening symptoms.  Final Clinical Impressions(s) / UC Diagnoses   Final diagnoses:  Acute non-recurrent maxillary sinusitis  Acute cough   Discharge Instructions   None    ED Prescriptions     Medication Sig Dispense Auth. Provider   amoxicillin-clavulanate (AUGMENTIN) 875-125 MG tablet Take 1 tablet by mouth every 12 (twelve) hours. 14 tablet Volney American, [place]      PDMP not reviewed this encounter.   Volney American, [place] 11/18/22 1422

## 2023-01-01 ENCOUNTER — Ambulatory Visit (INDEPENDENT_AMBULATORY_CARE_PROVIDER_SITE_OTHER): Payer: Commercial Managed Care - PPO | Admitting: Family Medicine

## 2023-01-01 VITALS — BP 134/76 | Ht 72.0 in | Wt 234.0 lb

## 2023-01-01 DIAGNOSIS — R7301 Impaired fasting glucose: Secondary | ICD-10-CM | POA: Diagnosis not present

## 2023-01-01 DIAGNOSIS — E7849 Other hyperlipidemia: Secondary | ICD-10-CM

## 2023-01-01 DIAGNOSIS — Z125 Encounter for screening for malignant neoplasm of prostate: Secondary | ICD-10-CM

## 2023-01-01 DIAGNOSIS — Z0000 Encounter for general adult medical examination without abnormal findings: Secondary | ICD-10-CM

## 2023-01-01 DIAGNOSIS — Z0001 Encounter for general adult medical examination with abnormal findings: Secondary | ICD-10-CM | POA: Diagnosis not present

## 2023-01-01 DIAGNOSIS — D696 Thrombocytopenia, unspecified: Secondary | ICD-10-CM

## 2023-01-01 DIAGNOSIS — Z1159 Encounter for screening for other viral diseases: Secondary | ICD-10-CM

## 2023-01-01 DIAGNOSIS — Z114 Encounter for screening for human immunodeficiency virus [HIV]: Secondary | ICD-10-CM

## 2023-01-01 NOTE — Progress Notes (Signed)
   Subjective:    Patient ID: Darrell Mckinney, male    DOB: [birth year], 57 y.o.   MRN: [id number]  HPI The patient comes in today for a wellness visit. 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  A review of medications was also completed.  Any needed refills; none  Eating habits: Tries to eat healthy  Falls/  MVA accidents in past few months: No accidents or injuries  Regular exercise: Does try to exercise on a regular basis  Specialist pt sees on regular basis: None  Preventative health issues were discussed.   Additional concerns: None    Review of Systems     Objective:   Physical Exam General-in no acute distress Eyes-no discharge Lungs-respiratory rate normal, CTA CV-no murmurs,RRR Extremities skin warm dry no edema Neuro grossly normal Behavior normal, alert Prostate exam normal       Assessment & Plan:  1. Wellness examination Adult wellness-complete.wellness physical was conducted today. Importance of diet and exercise were discussed in detail.  Importance of stress reduction and healthy living were discussed.  In addition to this a discussion regarding safety was also covered.  We also reviewed over immunizations and gave recommendations regarding current immunization needed for age.   In addition to this additional areas were also touched on including: Preventative health exams needed:  Colonoscopy 2027  Patient was advised yearly wellness exam  - CMP14+EGFR  2. Other hyperlipidemia Healthy diet check lipid profile - Lipid panel  3. Fasting hyperglycemia Minimize starches check A1c - Hemoglobin A1c  4. Thrombocytopenia (HCC) Platelets slightly low on previous lab work check CBC again - CBC with Differential  5. Screening PSA (prostate specific antigen) PSA has been normal in the past check level - PSA  6. Need for  hepatitis C screening test Screening - Hepatitis C Antibody  7. Screening for HIV (human immunodeficiency virus) Screening - HIV antibody (with reflex)  Patient to continue to exercise eat healthy try to bring his weight down toward 220 pounds if any ongoing troubles or problems notify us otherwise follow-up in 1 year

## 2023-01-03 LAB — CMP14+EGFR
ALT: 21 IU/L (ref 0–44)
AST: 20 IU/L (ref 0–40)
Albumin/Globulin Ratio: 2.6 — ABNORMAL HIGH (ref 1.2–2.2)
Albumin: 5.1 g/dL — ABNORMAL HIGH (ref 3.8–4.9)
Alkaline Phosphatase: 61 IU/L (ref 44–121)
BUN/Creatinine Ratio: 14 (ref 9–20)
BUN: 17 mg/dL (ref 6–24)
Bilirubin Total: 0.6 mg/dL (ref 0.0–1.2)
CO2: 24 mmol/L (ref 20–29)
Calcium: 10.1 mg/dL (ref 8.7–10.2)
Chloride: 101 mmol/L (ref 96–106)
Creatinine, Ser: 1.2 mg/dL (ref 0.76–1.27)
Globulin, Total: 2 g/dL (ref 1.5–4.5)
Glucose: 110 mg/dL — ABNORMAL HIGH (ref 70–99)
Potassium: 4.2 mmol/L (ref 3.5–5.2)
Sodium: 141 mmol/L (ref 134–144)
Total Protein: 7.1 g/dL (ref 6.0–8.5)
eGFR: 71 mL/min/{1.73_m2} (ref >59)

## 2023-01-03 LAB — CBC WITH DIFFERENTIAL/PLATELET
Basophils Absolute: 0 10*3/uL (ref 0.0–0.2)
Basos: 1 %
EOS (ABSOLUTE): 0.2 10*3/uL (ref 0.0–0.4)
Eos: 2 %
Hematocrit: 49.4 % (ref 37.5–51.0)
Hemoglobin: 17.1 g/dL (ref 13.0–17.7)
Immature Grans (Abs): 0 10*3/uL (ref 0.0–0.1)
Immature Granulocytes: 1 %
Lymphocytes Absolute: 1.5 10*3/uL (ref 0.7–3.1)
Lymphs: 25 %
MCH: 32.8 pg (ref 26.6–33.0)
MCHC: 34.6 g/dL (ref 31.5–35.7)
MCV: 95 fL (ref 79–97)
Monocytes Absolute: 0.7 10*3/uL (ref 0.1–0.9)
Monocytes: 11 %
Neutrophils Absolute: 3.9 10*3/uL (ref 1.4–7.0)
Neutrophils: 60 %
Platelets: 178 10*3/uL (ref 150–450)
RBC: 5.22 x10E6/uL (ref 4.14–5.80)
RDW: 11.3 % — ABNORMAL LOW (ref 11.6–15.4)
WBC: 6.3 10*3/uL (ref 3.4–10.8)

## 2023-01-03 LAB — HEMOGLOBIN A1C
Est. average glucose Bld gHb Est-mCnc: 108 mg/dL
Hgb A1c MFr Bld: 5.4 % (ref 4.8–5.6)

## 2023-01-03 LAB — HEPATITIS C ANTIBODY: Hep C Virus Ab: NONREACTIVE

## 2023-01-03 LAB — HIV ANTIBODY (ROUTINE TESTING W REFLEX): HIV Screen 4th Generation wRfx: NONREACTIVE

## 2023-01-03 LAB — LIPID PANEL
Chol/HDL Ratio: 4 ratio (ref 0.0–5.0)
Cholesterol, Total: 136 mg/dL (ref 100–199)
HDL: 34 mg/dL — ABNORMAL LOW (ref >39)
LDL Chol Calc (NIH): 70 mg/dL (ref 0–99)
Triglycerides: 187 mg/dL — ABNORMAL HIGH (ref 0–149)
VLDL Cholesterol Cal: 32 mg/dL (ref 5–40)

## 2023-01-03 LAB — PSA: Prostate Specific Ag, Serum: 2.5 ng/mL (ref 0.0–4.0)

## 2023-01-07 NOTE — Addendum Note (Signed)
Addended by: Dairl Ponder on: 01/07/2023 08:30 AM   Modules accepted: Orders

## 2023-05-01 LAB — LAB REPORT - SCANNED
A1c: 5.2
EGFR: 77

## 2023-05-04 ENCOUNTER — Encounter: Payer: Self-pay | Admitting: Family Medicine

## 2023-08-06 ENCOUNTER — Telehealth: Payer: Self-pay

## 2023-08-06 NOTE — Telephone Encounter (Signed)
Patient dropped off copy of EKG placed in Dr Lorin Picket folder up front

## 2023-08-06 NOTE — Telephone Encounter (Signed)
Prescription Request  08/06/2023  LOV: Visit date not found  What is the name of the medication or equipment? pantoprazole (PROTONIX) 40 MG tablet 90 days supply   Have you contacted your pharmacy to request a refill? Yes   Which pharmacy would you like this sent to? [facility]  Patient notified that their request is being sent to the clinical staff for review and that they should receive a response within 2 business days.   Please advise at Mobile [phone] (mobile)

## 2023-08-07 MED ORDER — PANTOPRAZOLE SODIUM 40 MG PO TBEC: 40.0000 mg | DELAYED_RELEASE_TABLET | Freq: Every day | ORAL | 3 refills | Status: AC

## 2023-08-07 NOTE — Telephone Encounter (Signed)
See My chart message

## 2023-08-09 NOTE — Telephone Encounter (Signed)
EKG looked normal was reviewed to be scanned into the system

## 2023-09-28 ENCOUNTER — Encounter: Payer: Self-pay | Admitting: Family Medicine

## 2023-09-28 ENCOUNTER — Telehealth: Payer: Self-pay

## 2023-09-28 DIAGNOSIS — M109 Gout, unspecified: Secondary | ICD-10-CM

## 2023-09-28 DIAGNOSIS — Z125 Encounter for screening for malignant neoplasm of prostate: Secondary | ICD-10-CM

## 2023-09-28 MED ORDER — COLCHICINE 0.6 MG PO CAPS: ORAL_CAPSULE | ORAL | 4 refills | Status: DC

## 2023-09-28 NOTE — Telephone Encounter (Signed)
Nurses I read over Triton's note Please do the following  Mitigare 0.6 mg may take 2 at the first sign of gout then 1 twice daily thereafter as needed for relief, #15 with 4 refills  Also he is due for a follow-up on his PSA please order follow-up PSA for screening as well as order uric acid level for gout  As for podiatry I recommend Dr. Tenna Child, or Dr. Gala Lewandowsky with [facility] if he needs a referral please give it thanks-Dr. Lorin Picket if any ongoing troubles recommend follow-up visit

## 2023-09-28 NOTE — Telephone Encounter (Signed)
Pt is wanting to get put on mitigare for gout from Argie Ramming and he is wanting to stay on it he only give him a 5 day supply and pt is wanting to make sure he has someone hand in case of a flare up.   Pt is leaving to go back out of town   [facility] [address] [place] - [address]   Jetty Duhamel

## 2023-09-29 MED ORDER — COLCHICINE 0.6 MG PO CAPS: ORAL_CAPSULE | ORAL | 2 refills | Status: AC

## 2023-09-29 NOTE — Telephone Encounter (Signed)
Mitigare 0.6 mg 1 twice daily as needed gout flareup, 60 tablets, 2 refills  I can send in a 30-day supply Some insurance companies will have a quantity limit per month Also we would be in his best interest to do uric acid levels with his next blood work And if he is having ongoing flareups of gout follow-up sooner He typically does a yearly wellness exam with [name] he can call closer to that time and we can order his lab work thank you

## 2023-10-12 ENCOUNTER — Ambulatory Visit (INDEPENDENT_AMBULATORY_CARE_PROVIDER_SITE_OTHER): Payer: Commercial Managed Care - PPO | Admitting: Podiatry

## 2023-10-12 DIAGNOSIS — Z91199 Patient's noncompliance with other medical treatment and regimen due to unspecified reason: Secondary | ICD-10-CM

## 2023-10-26 NOTE — Progress Notes (Signed)
   Complete physical exam  Patient: Darrell Mckinney   DOB: 09/13/1998   57 y.o. Male  MRN: [id number]  Subjective:    No chief complaint on file.   Darrell Mckinney is a 57 y.o. male who presents today for a complete physical exam. She reports consuming a {diet types:17450} diet. {types:19826} She generally feels {DESC; WELL/FAIRLY WELL/POORLY:18703}. She reports sleeping {DESC; WELL/FAIRLY WELL/POORLY:18703}. She {does/does not:200015} have additional problems to discuss today.    Most recent fall risk assessment:    05/21/2022   10:42 AM  Fall Risk   Falls in the past year? 0  Number falls in past yr: 0  Injury with Fall? 0  Risk for fall due to : No Fall Risks  Follow up Falls evaluation completed     Most recent depression screenings:    05/21/2022   10:42 AM 04/11/2021   10:46 AM  PHQ 2/9 Scores  PHQ - 2 Score 0 0  PHQ- 9 Score 5     {VISON DENTAL STD PSA (Optional):27386}  {History (Optional):23778}  Patient Care Team: Jessup, Joy, NP as PCP - General (Nurse Practitioner)   Outpatient Medications Prior to Visit  Medication Sig   fluticasone (FLONASE) 50 MCG/ACT nasal spray Place 2 sprays into both nostrils in the morning and at bedtime. After 7 days, reduce to once daily.   norgestimate-ethinyl estradiol (SPRINTEC 28) 0.25-35 MG-MCG tablet Take 1 tablet by mouth daily.   Nystatin POWD Apply liberally to affected area 2 times per day   spironolactone (ALDACTONE) 100 MG tablet Take 1 tablet (100 mg total) by mouth daily.   No facility-administered medications prior to visit.    ROS        Objective:     There were no vitals taken for this visit. {Vitals History (Optional):23777}  Physical Exam   No results found for any visits on 06/26/22. {Show previous labs (optional):23779}    Assessment & Plan:    Routine Health Maintenance and Physical Exam  Immunization History  Administered Date(s) Administered   DTaP 11/27/1999, 01/23/2000,  04/02/2000, 12/17/2000, 07/02/2004   Hepatitis A 04/28/2008, 05/04/2009   Hepatitis B 09/14/1999, 10/22/1999, 04/02/2000   HiB (PRP-OMP) 11/27/1999, 01/23/2000, 04/02/2000, 12/17/2000   IPV 11/27/1999, 01/23/2000, 09/21/2000, 07/02/2004   Influenza,inj,Quad PF,6+ Mos 08/04/2014   Influenza-Unspecified 11/03/2012   MMR 09/21/2001, 07/02/2004   Meningococcal Polysaccharide 05/03/2012   Pneumococcal Conjugate-13 12/17/2000   Pneumococcal-Unspecified 04/02/2000, 06/16/2000   Tdap 05/03/2012   Varicella 09/21/2000, 04/28/2008    Health Maintenance  Topic Date Due   HIV Screening  Never done   Hepatitis C Screening  Never done   INFLUENZA VACCINE  06/24/2022   PAP-Cervical Cytology Screening  06/26/2022 (Originally 09/12/2020)   PAP SMEAR-Modifier  06/26/2022 (Originally 09/12/2020)   TETANUS/TDAP  06/26/2022 (Originally 05/03/2022)   HPV VACCINES  Discontinued   COVID-19 Vaccine  Discontinued    Discussed health benefits of physical activity, and encouraged her to engage in regular exercise appropriate for her age and condition.  Problem List Items Addressed This Visit   None Visit Diagnoses     Annual physical exam    -  Primary   Cervical cancer screening       Need for Tdap vaccination          No follow-ups on file.     Joy Jessup, NP   

## 2023-10-28 ENCOUNTER — Ambulatory Visit (INDEPENDENT_AMBULATORY_CARE_PROVIDER_SITE_OTHER): Payer: Commercial Managed Care - PPO | Admitting: Podiatry

## 2023-10-28 ENCOUNTER — Ambulatory Visit (INDEPENDENT_AMBULATORY_CARE_PROVIDER_SITE_OTHER): Payer: Commercial Managed Care - PPO

## 2023-10-28 DIAGNOSIS — M898X7 Other specified disorders of bone, ankle and foot: Secondary | ICD-10-CM

## 2023-10-28 DIAGNOSIS — M79671 Pain in right foot: Secondary | ICD-10-CM

## 2023-10-28 NOTE — Progress Notes (Signed)
No chief complaint on file.   HPI: 57 y.o. male presenting today as a new patient for evaluation of a symptomatic prominence overlying the dorsum of the right foot which has been present about 8-9 years.  It is intermittently painful.  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  Almost daily he has to loosen his shoes because of the pain caused by the large prominence overlying the dorsum of the foot.  He has not done anything for treatment.  Most recently he had an acute gout flareup which exacerbated his pain.  Past Medical History:  Diagnosis Date   Acid reflux    Back pain    Bulging lumbar disc    Fatigue    GERD (gastroesophageal reflux disease)    Hay fever    History of basal cell cancer 12/09/2018   Removed December 2020 left inner eye skin fold   Joint pain    Muscle pain    Sciatic pain    Seasonal allergies     Past Surgical History:  Procedure Laterality Date   BACK SURGERY  ~2006   COLONOSCOPY  12/2008   RMR: normal   COLONOSCOPY N/A 10/26/2015   Procedure: COLONOSCOPY;  Surgeon: Corbin Ade, MD;  [facility];  Service: Endoscopy;  Laterality: N/A;  11:15 - moved to 12:00 - pt knows to arrive at 11:00   COLONOSCOPY WITH PROPOFOL N/A 03/21/2021   Procedure: COLONOSCOPY WITH PROPOFOL;  Surgeon: Corbin Ade, MD;  [facility];  Service: Endoscopy;  Laterality: N/A;  ASA II / 10:45   POLYPECTOMY  03/21/2021   Procedure: POLYPECTOMY INTESTINAL;  Surgeon: Corbin Ade, MD;  [facility];  Service: Endoscopy;;   TURBINATE RESECTION Bilateral 01/25/2013   Procedure: BILATERAL TURBINATE RESECTION;  Surgeon: Darletta Moll, MD;  Location: [facility];  Service: ENT;  Laterality: Bilateral;    No Known Allergies   Physical Exam: General: The patient is alert and oriented x3 in no acute distress.  Dermatology: Skin is warm, dry and supple bilateral lower extremities.   Vascular: Palpable pedal pulses  bilaterally. Capillary refill within normal limits.  No appreciable edema.  No erythema.  Neurological: Grossly intact via light touch  Musculoskeletal Exam: Large prominence overlying the first TMT of the right foot.  Prominence is hard and well adhered favoring exostosis versus cyst  Radiographic Exam RT foot 10/28/2023:  Normal osseous mineralization. Joint spaces preserved.  No fractures or osseous irregularities noted.  On lateral view there is a prominence noted overlying the dorsum of the midfoot.  Clinically there is no fluctuance and although it is not obvious radiographically it is likely secondary to underlying exostosis of the first TMT  Assessment/Plan of Care: 1.  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  This has been present and symptomatic over the last 8 years and recently exacerbated by his acute gout flareup. -Surgery was discussed in detail with the patient today.  Surgery would consist of an exostectomy to the base of the first metatarsal as well as the medial cuneiform of the right foot.  Risk benefits advantages and disadvantages of the procedure were explained in detail to the patient.  No guarantees were expressed or implied.  After discussing with the patient  as well as the postoperative recovery course he did consent for surgery today and would like to proceed -Authorization for surgery was initiated today.  Surgery will consist of exostectomy first TMT RT foot -Return to clinic 1 week postop  *Buyer, retail for [employer]       Felecia Shelling, DPM [facility]  Dr. Felecia Shelling, DPM    [address] [place], [place] [address]                Office [phone]  Fax [phone]

## 2023-11-03 ENCOUNTER — Telehealth: Payer: Self-pay | Admitting: Podiatry

## 2023-11-03 NOTE — Telephone Encounter (Signed)
DOS-11/12/23  TARSAL EXOSTECTOMY RT X2- 28104  UMR EFFECTIVE DATE-11/24/22  DEDUCTIBLE-$ 0.00 WITH REMAINING $0.00 OOP-$5700.00 WITH REMAINING $0,102.72  COINSURANCE- 20%  SPOKE WITH [place] FROM [facility], [place] STATED THAT THE AUTHORIZATION WAS AUTO APPROVED FOR CPT CODE 53664. GOOD FROM 11/12/23 - 05/12/2024.  AUTHORIZATION NUMBER: [id number]  CALL REFERENCE NUMBER: [id number]

## 2023-11-12 ENCOUNTER — Other Ambulatory Visit: Payer: Self-pay | Admitting: Podiatry

## 2023-11-12 DIAGNOSIS — M898X7 Other specified disorders of bone, ankle and foot: Secondary | ICD-10-CM | POA: Diagnosis not present

## 2023-11-12 MED ORDER — IBUPROFEN 800 MG PO TABS: 800.0000 mg | ORAL_TABLET | Freq: Three times a day (TID) | ORAL | 1 refills | Status: AC | PRN

## 2023-11-12 MED ORDER — OXYCODONE-ACETAMINOPHEN 5-325 MG PO TABS: 1.0000 | ORAL_TABLET | ORAL | 0 refills | Status: DC | PRN

## 2023-11-12 NOTE — Progress Notes (Signed)
PRN postop 

## 2023-11-23 ENCOUNTER — Ambulatory Visit (INDEPENDENT_AMBULATORY_CARE_PROVIDER_SITE_OTHER): Payer: Commercial Managed Care - PPO

## 2023-11-23 ENCOUNTER — Ambulatory Visit (INDEPENDENT_AMBULATORY_CARE_PROVIDER_SITE_OTHER): Payer: Commercial Managed Care - PPO | Admitting: Podiatry

## 2023-11-23 ENCOUNTER — Other Ambulatory Visit: Payer: Self-pay | Admitting: Podiatry

## 2023-11-23 ENCOUNTER — Encounter: Payer: Self-pay | Admitting: Podiatry

## 2023-11-23 DIAGNOSIS — M778 Other enthesopathies, not elsewhere classified: Secondary | ICD-10-CM

## 2023-11-23 DIAGNOSIS — Z9889 Other specified postprocedural states: Secondary | ICD-10-CM

## 2023-11-30 ENCOUNTER — Ambulatory Visit (INDEPENDENT_AMBULATORY_CARE_PROVIDER_SITE_OTHER): Payer: Commercial Managed Care - PPO | Admitting: Podiatry

## 2023-11-30 ENCOUNTER — Encounter: Payer: Self-pay | Admitting: Podiatry

## 2023-11-30 DIAGNOSIS — Z9889 Other specified postprocedural states: Secondary | ICD-10-CM

## 2023-11-30 NOTE — Progress Notes (Signed)
   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  DOS: 11/12/2023.  Patient continues to do well.  He is wearing tennis shoes today. Past Medical History:  Diagnosis Date   Acid reflux    Back pain    Bulging lumbar disc    Fatigue    GERD (gastroesophageal reflux disease)    Hay fever    History of basal cell cancer 12/09/2018   Removed December 2020 left inner eye skin fold   Joint pain    Muscle pain    Sciatic pain    Seasonal allergies     Past Surgical History:  Procedure Laterality Date   BACK SURGERY  ~2006   COLONOSCOPY  12/2008   RMR: normal   COLONOSCOPY N/A 10/26/2015   Procedure: COLONOSCOPY;  Surgeon: Lamar CHRISTELLA Hollingshead, MD;  [facility];  Service: Endoscopy;  Laterality: N/A;  11:15 - moved to 12:00 - pt knows to arrive at 11:00   COLONOSCOPY WITH PROPOFOL  N/A 03/21/2021   Procedure: COLONOSCOPY WITH PROPOFOL ;  Surgeon: Hollingshead Lamar CHRISTELLA, MD;  [facility];  Service: Endoscopy;  Laterality: N/A;  ASA II / 10:45   POLYPECTOMY  03/21/2021   Procedure: POLYPECTOMY INTESTINAL;  Surgeon: Hollingshead Lamar CHRISTELLA, MD;  [facility];  Service: Endoscopy;;   TURBINATE RESECTION Bilateral 01/25/2013   Procedure: BILATERAL TURBINATE RESECTION;  Surgeon: Ana LELON Moccasin, MD;  Location: [facility];  Service: ENT;  Laterality: Bilateral;    No Known Allergies  Objective/Physical Exam Neurovascular status intact.  Incision well coapted with sutures intact. No sign of infectious process noted. No dehiscence. No active bleeding noted.  Improved edema noted localized to the surgical area  Radiographic Exam RT foot 11/23/2023:  Reduction of the soft tissue edema around the first TMT.  Clean osteotomy noted around the first  MTP.  Assessment: 1. s/p dorsal exostectomy to the first TMT of the right foot.  DOS: 11/12/2023   Plan of Care:  -Patient was evaluated.  Sutures removed -Patient doing very well.  He may now resume full activity with no restrictions -Return to clinic as needed  Museum/gallery Exhibitions Officer for [employer]  Thresa EMERSON Sar, DPM [facility]  Dr. Thresa EMERSON Sar, DPM    [address] [place], [place] [address]                Office [phone]  Fax [phone]

## 2023-12-02 ENCOUNTER — Encounter: Payer: Commercial Managed Care - PPO | Admitting: Podiatry

## 2023-12-14 ENCOUNTER — Encounter: Payer: Commercial Managed Care - PPO | Admitting: Podiatry

## 2023-12-18 ENCOUNTER — Telehealth: Payer: Self-pay

## 2023-12-18 ENCOUNTER — Other Ambulatory Visit: Payer: Self-pay

## 2023-12-18 DIAGNOSIS — Z79899 Other long term (current) drug therapy: Secondary | ICD-10-CM

## 2023-12-18 DIAGNOSIS — E7849 Other hyperlipidemia: Secondary | ICD-10-CM

## 2023-12-18 DIAGNOSIS — R7301 Impaired fasting glucose: Secondary | ICD-10-CM

## 2023-12-18 DIAGNOSIS — Z125 Encounter for screening for malignant neoplasm of prostate: Secondary | ICD-10-CM

## 2023-12-18 DIAGNOSIS — Z0000 Encounter for general adult medical examination without abnormal findings: Secondary | ICD-10-CM

## 2023-12-18 NOTE — Telephone Encounter (Signed)
Lipid, liver, metabolic 7, A1c, CBC, PSA  Wellness, hyperlipidemia, fasting hyperglycemia, screening prostate cancer

## 2023-12-18 NOTE — Telephone Encounter (Signed)
Copied from CRM [id number]. Topic: Clinical - Request for Lab/Test Order >> Dec 18, 2023 11:02 AM Darrell Mckinney wrote: Reason for CRM: patient is calling cause he has his physical appointment for feb 11th and wants to make sure the blood work order was put in so he can go next week or on Thursday morning to get blood drawn

## 2023-12-18 NOTE — Telephone Encounter (Signed)
Last labs 12/2022 lip, psa, hiv, hep c, A1c, cbc, cmp next appt on 01/04/24

## 2023-12-25 ENCOUNTER — Encounter: Payer: Self-pay | Admitting: Family Medicine

## 2023-12-25 LAB — CBC WITH DIFFERENTIAL/PLATELET
Basophils Absolute: 0 10*3/uL (ref 0.0–0.2)
Basos: 0 %
EOS (ABSOLUTE): 0.1 10*3/uL (ref 0.0–0.4)
Eos: 1 %
Hematocrit: 46.6 % (ref 37.5–51.0)
Hemoglobin: 16 g/dL (ref 13.0–17.7)
Immature Grans (Abs): 0 10*3/uL (ref 0.0–0.1)
Immature Granulocytes: 0 %
Lymphocytes Absolute: 1.8 10*3/uL (ref 0.7–3.1)
Lymphs: 29 %
MCH: 33.5 pg — ABNORMAL HIGH (ref 26.6–33.0)
MCHC: 34.3 g/dL (ref 31.5–35.7)
MCV: 98 fL — ABNORMAL HIGH (ref 79–97)
Monocytes Absolute: 0.6 10*3/uL (ref 0.1–0.9)
Monocytes: 10 %
Neutrophils Absolute: 3.7 10*3/uL (ref 1.4–7.0)
Neutrophils: 60 %
Platelets: 171 10*3/uL (ref 150–450)
RBC: 4.78 x10E6/uL (ref 4.14–5.80)
RDW: 12.2 % (ref 11.6–15.4)
WBC: 6.2 10*3/uL (ref 3.4–10.8)

## 2023-12-25 LAB — LIPID PANEL
Chol/HDL Ratio: 2.7 {ratio} (ref 0.0–5.0)
Cholesterol, Total: 117 mg/dL (ref 100–199)
HDL: 43 mg/dL (ref >39)
LDL Chol Calc (NIH): 56 mg/dL (ref 0–99)
Triglycerides: 97 mg/dL (ref 0–149)
VLDL Cholesterol Cal: 18 mg/dL (ref 5–40)

## 2023-12-25 LAB — HEPATIC FUNCTION PANEL
ALT: 16 [IU]/L (ref 0–44)
AST: 18 [IU]/L (ref 0–40)
Albumin: 4.7 g/dL (ref 3.8–4.9)
Alkaline Phosphatase: 64 [IU]/L (ref 44–121)
Bilirubin Total: 0.5 mg/dL (ref 0.0–1.2)
Bilirubin, Direct: 0.18 mg/dL (ref 0.00–0.40)
Total Protein: 6.6 g/dL (ref 6.0–8.5)

## 2023-12-25 LAB — HEMOGLOBIN A1C
Est. average glucose Bld gHb Est-mCnc: 94 mg/dL
Hgb A1c MFr Bld: 4.9 % (ref 4.8–5.6)

## 2023-12-25 LAB — BASIC METABOLIC PANEL
BUN/Creatinine Ratio: 12 (ref 9–20)
BUN: 13 mg/dL (ref 6–24)
CO2: 23 mmol/L (ref 20–29)
Calcium: 9.4 mg/dL (ref 8.7–10.2)
Chloride: 103 mmol/L (ref 96–106)
Creatinine, Ser: 1.08 mg/dL (ref 0.76–1.27)
Glucose: 99 mg/dL (ref 70–99)
Potassium: 4.1 mmol/L (ref 3.5–5.2)
Sodium: 143 mmol/L (ref 134–144)
eGFR: 81 mL/min/{1.73_m2} (ref >59)

## 2023-12-25 LAB — PSA: Prostate Specific Ag, Serum: 0.6 ng/mL (ref 0.0–4.0)

## 2024-01-04 ENCOUNTER — Ambulatory Visit (INDEPENDENT_AMBULATORY_CARE_PROVIDER_SITE_OTHER): Payer: Commercial Managed Care - PPO | Admitting: Family Medicine

## 2024-01-04 ENCOUNTER — Encounter: Payer: Self-pay | Admitting: Family Medicine

## 2024-01-04 VITALS — BP 118/76 | HR 63 | Temp 98.4°F | Ht 72.0 in | Wt 213.0 lb

## 2024-01-04 DIAGNOSIS — Z0001 Encounter for general adult medical examination with abnormal findings: Secondary | ICD-10-CM

## 2024-01-04 DIAGNOSIS — M109 Gout, unspecified: Secondary | ICD-10-CM

## 2024-01-04 DIAGNOSIS — D7589 Other specified diseases of blood and blood-forming organs: Secondary | ICD-10-CM | POA: Diagnosis not present

## 2024-01-04 DIAGNOSIS — Z0000 Encounter for general adult medical examination without abnormal findings: Secondary | ICD-10-CM

## 2024-01-04 NOTE — Progress Notes (Signed)
   Subjective:    Patient ID: Darrell Mckinney, male    DOB: [birth year], 58 y.o.   MRN: [id number]  HPI The patient comes in today for a wellness visit.    A review of their health history was completed.  A review of medications was also completed.  Any needed refills; none currently  Eating habits: Overall good eating patterns  Falls/  MVA accidents in past few months: No accidents or injuries  Regular exercise: Exercises on a regular basis  Specialist pt sees on regular basis: None currently  Preventative health issues were discussed.   Additional concerns: None    Review of Systems     Objective:   Physical Exam General-in no acute distress Eyes-no discharge Lungs-respiratory rate normal, CTA CV-no murmurs,RRR Extremities skin warm dry no edema Neuro grossly normal Behavior normal, alert Prostate soft no masses       Assessment & Plan:   Adult wellness-complete.wellness physical was conducted today. Importance of diet and exercise were discussed in detail.  Importance of stress reduction and healthy living were discussed.  In addition to this a discussion regarding safety was also covered.  We also reviewed over immunizations and gave recommendations regarding current immunization needed for age.   In addition to this additional areas were also touched on including: Preventative health exams needed:  Colonoscopy 2027  Patient was advised yearly wellness exam Overall his good health is doing well he is doing the best he can with healthy eating regular physical activity.  Denies any setbacks Uses Ambien  very rarely always develops 8 hours asleep for that medicine States he is doing well with stress levels Patient has had some flareups of gout In addition to this he has also had a couple different instances of slight discomfort in his foot that he wondered if it could be gout  Also recent lab work shows slight elevation of MCV so we will check B12 and  folate His PSA is normal We also see no sign of any type of significant liver kidney or cholesterol issues.  Healthy diet recommended.  Patient has good health habits.  Follow-up in 1 year for wellness checkup follow-up sooner if any problems

## 2024-01-04 NOTE — Patient Instructions (Addendum)
 Shingrix and shingles prevention: know the facts!   Shingrix is a very effective vaccine to prevent shingles.   Shingles is a reactivation of chickenpox -more than 99% of Americans born before 1980 have had chickenpox even if they do not remember it. One in every 10 people who get shingles have severe long-lasting nerve pain as a result.   33 out of a 100 older adults will get shingles if they are unvaccinated.     This vaccine is very important for your health This vaccine is indicated for anyone 50 years or older. You can get this vaccine even if you have already had shingles because you can get the disease more than once in a lifetime.  Your risk for shingles and its complications increases with age.  This vaccine has 2 doses.  The second dose would be 2 to 6 months after the first dose.  If you had Zostavax vaccine in the past you should still get Shingrix. ( Zostavax is only 70% effective and it loses significant strength over a few years .)  This vaccine is given through the pharmacy.  The cost of the vaccine is through your insurance. The pharmacy can inform you of the total costs.  Common side effects including soreness in the arm, some redness and swelling, also some feel fatigue muscle soreness headache low-grade fever.  Side effects typically go away within 2 to 3 days. Remember-the pain from shingles can last a lifetime but these side effects of the vaccine will only last a few days at most. It is very important to get both doses in order to protect yourself fully.   Please get this vaccine at your earliest convenience at your trusted pharmacy.     Low-Purine Eating Plan A low-purine eating plan involves making food choices to limit your purine intake. Purine is a kind of uric acid. Too much uric acid in your blood can cause certain conditions, such as gout and kidney stones. Eating a low-purine diet may help control these conditions. What are tips for following this  plan? Shopping Avoid buying products that contain high-fructose corn syrup. Check for this on food labels. It is commonly found in many processed foods and soft drinks. Be sure to check for it in baked goods such as cookies, canned fruits, and cereals and cereal bars. Avoid buying veal, chicken breast with skin, lamb, and organ meats such as liver. These types of meats tend to have the highest purine content. Choose dairy products. These may lower uric acid levels. Avoid certain types of fish. Not all fish and seafood have high purine content. Examples with high purine content include anchovies, trout, tuna, sardines, and salmon. Avoid buying beverages that contain alcohol, particularly beer and hard liquor. Alcohol can affect the way your body gets rid of uric acid. Meal planning  Learn which foods do or do not affect you. If you find out that a food tends to cause your gout symptoms to flare up, avoid eating that food. You can enjoy foods that do not cause problems. If you have any questions about a food item, talk with your dietitian or health care provider. Reduce the overall amount of meat in your diet. When you do eat meat, choose ones with lower purine content. Include plenty of fruits and vegetables. Although some vegetables may have a high purine content--such as asparagus, mushrooms, spinach, or cauliflower--it has been shown that these do not contribute to uric acid blood levels as much. Consume at least  1 dairy serving a day. This has been shown to decrease uric acid levels. General information If you drink alcohol: Limit how much you have to: 0-1 drink a day for women who are not pregnant. 0-2 drinks a day for men. Know how much alcohol is in a drink. In the [place] one drink equals one 12 oz bottle of beer (355 mL), one 5 oz glass of wine (148 mL), or one 1 oz glass of hard liquor (44 mL). Drink plenty of water . Try to drink enough to keep your urine pale yellow. Fluids can help  remove uric acid from your body. Work with your health care provider and dietitian to develop a plan to achieve or maintain a healthy weight. Losing weight may help reduce uric acid in your blood. What foods are recommended? The following are some types of foods that are good choices when limiting purine intake: Fresh or frozen fruits and vegetables. Whole grains, breads, cereals, and pasta. Rice. Beans, peas, legumes. Nuts and seeds. Dairy products. Fats and oils. The items listed above may not be a complete list. Talk with a dietitian about what dietary choices are best for you. What foods are not recommended? Limit your intake of foods high in purines, including: Beer and other alcohol. Meat-based gravy or sauce. Canned or fresh fish, such as: Anchovies, sardines, herring, salmon, and tuna. Mussels and scallops. Codfish, trout, and haddock. Bacon, veal, chicken breast with skin, and lamb. Organ meats, such as: Liver or kidney. Tripe. Sweetbreads (thymus gland or pancreas). Wild Education officer, environmental. Yeast or yeast extract supplements. Drinks sweetened with high-fructose corn syrup, such as soda. Processed foods made with high-fructose corn syrup. The items listed above may not be a complete list of foods and beverages you should limit. Contact a dietitian for more information. Summary Eating a low-purine diet may help control conditions caused by too much uric acid in the body, such as gout or kidney stones. Choose low-purine foods, limit alcohol, and limit high-fructose corn syrup. You will learn over time which foods do or do not affect you. If you find out that a food tends to cause your gout symptoms to flare up, avoid eating that food. This information is not intended to replace advice given to you by your health care provider. Make sure you discuss any questions you have with your health care provider. Document Revised: 10/24/2021 Document Reviewed: 10/24/2021 Elsevier Patient  Education  2024 [employer].

## 2024-01-05 ENCOUNTER — Encounter: Payer: Self-pay | Admitting: Family Medicine

## 2024-01-05 LAB — VITAMIN B12: Vitamin B-12: 542 pg/mL (ref 232–1245)

## 2024-01-05 LAB — FOLATE: Folate: >20 ng/mL (ref >3.0)

## 2024-01-05 LAB — URIC ACID: Uric Acid: 6.2 mg/dL (ref 3.8–8.4)

## 2024-08-05 ENCOUNTER — Telehealth: Payer: Self-pay | Admitting: Family Medicine

## 2024-08-05 NOTE — Telephone Encounter (Signed)
 Patient dropped off EKG for you to review. In your red folder in box

## 2024-08-09 NOTE — Telephone Encounter (Signed)
 EKG was reviewed it was normal in scan was written on the EKG to be scanned into epic

## 2024-12-27 ENCOUNTER — Other Ambulatory Visit: Payer: Self-pay | Admitting: Family Medicine

## 2024-12-27 ENCOUNTER — Encounter: Payer: Self-pay | Admitting: Family Medicine

## 2024-12-27 DIAGNOSIS — Z125 Encounter for screening for malignant neoplasm of prostate: Secondary | ICD-10-CM

## 2024-12-27 DIAGNOSIS — E7849 Other hyperlipidemia: Secondary | ICD-10-CM

## 2024-12-27 DIAGNOSIS — D7589 Other specified diseases of blood and blood-forming organs: Secondary | ICD-10-CM

## 2024-12-27 DIAGNOSIS — Z0000 Encounter for general adult medical examination without abnormal findings: Secondary | ICD-10-CM

## 2024-12-27 DIAGNOSIS — R7301 Impaired fasting glucose: Secondary | ICD-10-CM

## 2024-12-27 DIAGNOSIS — Z8739 Personal history of other diseases of the musculoskeletal system and connective tissue: Secondary | ICD-10-CM

## 2024-12-29 ENCOUNTER — Ambulatory Visit: Payer: Self-pay | Admitting: Family Medicine

## 2024-12-29 LAB — CBC WITH DIFFERENTIAL/PLATELET
Basophils Absolute: 0 10*3/uL (ref 0.0–0.2)
Basos: 1 %
EOS (ABSOLUTE): 0.1 10*3/uL (ref 0.0–0.4)
Eos: 2 %
Hematocrit: 43.6 % (ref 37.5–51.0)
Hemoglobin: 15 g/dL (ref 13.0–17.7)
Immature Grans (Abs): 0 10*3/uL (ref 0.0–0.1)
Immature Granulocytes: 0 %
Lymphocytes Absolute: 1.7 10*3/uL (ref 0.7–3.1)
Lymphs: 31 %
MCH: 33.2 pg — ABNORMAL HIGH (ref 26.6–33.0)
MCHC: 34.4 g/dL (ref 31.5–35.7)
MCV: 97 fL (ref 79–97)
Monocytes Absolute: 0.7 10*3/uL (ref 0.1–0.9)
Monocytes: 12 %
Neutrophils Absolute: 3.1 10*3/uL (ref 1.4–7.0)
Neutrophils: 54 %
Platelets: 164 10*3/uL (ref 150–450)
RBC: 4.52 x10E6/uL (ref 4.14–5.80)
RDW: 12 % (ref 11.6–15.4)
WBC: 5.6 10*3/uL (ref 3.4–10.8)

## 2024-12-29 LAB — LIPID PANEL
Chol/HDL Ratio: 2.6 ratio (ref 0.0–5.0)
Cholesterol, Total: 109 mg/dL (ref 100–199)
HDL: 42 mg/dL
LDL Chol Calc (NIH): 51 mg/dL (ref 0–99)
Triglycerides: 78 mg/dL (ref 0–149)
VLDL Cholesterol Cal: 16 mg/dL (ref 5–40)

## 2024-12-29 LAB — HEPATIC FUNCTION PANEL
ALT: 21 [IU]/L (ref 0–44)
AST: 26 [IU]/L (ref 0–40)
Albumin: 4.5 g/dL (ref 3.8–4.9)
Alkaline Phosphatase: 57 [IU]/L (ref 47–123)
Bilirubin Total: 0.4 mg/dL (ref 0.0–1.2)
Bilirubin, Direct: 0.15 mg/dL (ref 0.00–0.40)
Total Protein: 6.2 g/dL (ref 6.0–8.5)

## 2024-12-29 LAB — BASIC METABOLIC PANEL WITH GFR
BUN/Creatinine Ratio: 14 (ref 9–20)
BUN: 16 mg/dL (ref 6–24)
CO2: 22 mmol/L (ref 20–29)
Calcium: 9.4 mg/dL (ref 8.7–10.2)
Chloride: 107 mmol/L — ABNORMAL HIGH (ref 96–106)
Creatinine, Ser: 1.11 mg/dL (ref 0.76–1.27)
Glucose: 88 mg/dL (ref 70–99)
Potassium: 3.9 mmol/L (ref 3.5–5.2)
Sodium: 144 mmol/L (ref 134–144)
eGFR: 77 mL/min/{1.73_m2}

## 2024-12-29 LAB — HEMOGLOBIN A1C
Est. average glucose Bld gHb Est-mCnc: 94 mg/dL
Hgb A1c MFr Bld: 4.9 % (ref 4.8–5.6)

## 2024-12-29 LAB — PSA: Prostate Specific Ag, Serum: 0.5 ng/mL (ref 0.0–4.0)

## 2024-12-29 LAB — URIC ACID: Uric Acid: 7.4 mg/dL (ref 3.8–8.4)

## 2025-01-04 ENCOUNTER — Encounter: Admitting: Family Medicine
